# Patient Record
Sex: Female | Born: 1940 | Race: White | Hispanic: No | Marital: Single | Smoking: Former smoker
Health system: Southern US, Community
[De-identification: ages and names within clinical notes are randomized; demographics above are authoritative.]

## PROBLEM LIST (undated history)

## (undated) DIAGNOSIS — R569 Unspecified convulsions: Secondary | ICD-10-CM

## (undated) DIAGNOSIS — I10 Essential (primary) hypertension: Secondary | ICD-10-CM

## (undated) DIAGNOSIS — G609 Hereditary and idiopathic neuropathy, unspecified: Secondary | ICD-10-CM

## (undated) DIAGNOSIS — H90A22 Sensorineural hearing loss, unilateral, left ear, with restricted hearing on the contralateral side: Secondary | ICD-10-CM

## (undated) DIAGNOSIS — Z8619 Personal history of other infectious and parasitic diseases: Secondary | ICD-10-CM

## (undated) DIAGNOSIS — I639 Cerebral infarction, unspecified: Secondary | ICD-10-CM

## (undated) DIAGNOSIS — Z8579 Personal history of other malignant neoplasms of lymphoid, hematopoietic and related tissues: Secondary | ICD-10-CM

## (undated) DIAGNOSIS — M810 Age-related osteoporosis without current pathological fracture: Secondary | ICD-10-CM

## (undated) DIAGNOSIS — E559 Vitamin D deficiency, unspecified: Secondary | ICD-10-CM

## (undated) DIAGNOSIS — Z8673 Personal history of transient ischemic attack (TIA), and cerebral infarction without residual deficits: Secondary | ICD-10-CM

## (undated) HISTORY — DX: Unspecified convulsions: R56.9

## (undated) HISTORY — DX: Personal history of other malignant neoplasms of lymphoid, hematopoietic and related tissues: Z85.79

## (undated) HISTORY — DX: Personal history of other infectious and parasitic diseases: Z86.19

## (undated) HISTORY — DX: Personal history of transient ischemic attack (TIA), and cerebral infarction without residual deficits: Z86.73

## (undated) HISTORY — DX: Essential (primary) hypertension: I10

## (undated) HISTORY — DX: Sensorineural hearing loss, unilateral, left ear, with restricted hearing on the contralateral side: H90.A22

## (undated) HISTORY — DX: Hereditary and idiopathic neuropathy, unspecified: G60.9

## (undated) HISTORY — DX: Cerebral infarction, unspecified: I63.9

## (undated) HISTORY — DX: Age-related osteoporosis without current pathological fracture: M81.0

## (undated) HISTORY — DX: Vitamin D deficiency, unspecified: E55.9

---

## 2002-07-06 DIAGNOSIS — Z8579 Personal history of other malignant neoplasms of lymphoid, hematopoietic and related tissues: Secondary | ICD-10-CM

## 2002-07-06 DIAGNOSIS — Z8572 Personal history of non-Hodgkin lymphomas: Secondary | ICD-10-CM

## 2002-07-06 HISTORY — PX: SPLENECTOMY: SUR1306

## 2002-07-06 HISTORY — DX: Personal history of non-Hodgkin lymphomas: Z85.72

## 2002-07-06 HISTORY — PX: PARTIAL GASTRECTOMY: SHX2172

## 2002-07-06 HISTORY — DX: Personal history of other malignant neoplasms of lymphoid, hematopoietic and related tissues: Z85.79

## 2009-01-28 ENCOUNTER — Emergency Department: Payer: Self-pay | Admitting: Emergency Medicine

## 2009-07-17 ENCOUNTER — Emergency Department: Payer: Self-pay | Admitting: Emergency Medicine

## 2009-09-03 ENCOUNTER — Ambulatory Visit: Payer: Self-pay | Admitting: Oncology

## 2010-01-03 ENCOUNTER — Ambulatory Visit: Payer: Self-pay | Admitting: Oncology

## 2010-01-28 ENCOUNTER — Ambulatory Visit: Payer: Self-pay | Admitting: Oncology

## 2010-01-31 ENCOUNTER — Ambulatory Visit: Payer: Self-pay | Admitting: Oncology

## 2010-03-20 ENCOUNTER — Ambulatory Visit: Payer: Self-pay | Admitting: Internal Medicine

## 2010-10-08 ENCOUNTER — Ambulatory Visit: Payer: Self-pay | Admitting: Unknown Physician Specialty

## 2011-03-27 ENCOUNTER — Ambulatory Visit: Payer: Self-pay | Admitting: Oncology

## 2011-04-06 ENCOUNTER — Ambulatory Visit: Payer: Self-pay | Admitting: Oncology

## 2011-05-01 ENCOUNTER — Ambulatory Visit: Payer: Self-pay | Admitting: Internal Medicine

## 2011-05-07 ENCOUNTER — Ambulatory Visit: Payer: Self-pay | Admitting: Oncology

## 2011-08-26 DIAGNOSIS — H16149 Punctate keratitis, unspecified eye: Secondary | ICD-10-CM | POA: Diagnosis not present

## 2011-11-22 ENCOUNTER — Ambulatory Visit: Payer: Self-pay | Admitting: Internal Medicine

## 2011-12-02 DIAGNOSIS — C8589 Other specified types of non-Hodgkin lymphoma, extranodal and solid organ sites: Secondary | ICD-10-CM | POA: Diagnosis not present

## 2011-12-02 DIAGNOSIS — G40909 Epilepsy, unspecified, not intractable, without status epilepticus: Secondary | ICD-10-CM | POA: Diagnosis not present

## 2011-12-02 DIAGNOSIS — I10 Essential (primary) hypertension: Secondary | ICD-10-CM | POA: Diagnosis not present

## 2011-12-02 DIAGNOSIS — Z202 Contact with and (suspected) exposure to infections with a predominantly sexual mode of transmission: Secondary | ICD-10-CM | POA: Diagnosis not present

## 2011-12-02 DIAGNOSIS — Z79899 Other long term (current) drug therapy: Secondary | ICD-10-CM | POA: Diagnosis not present

## 2011-12-20 ENCOUNTER — Ambulatory Visit: Payer: Self-pay | Admitting: Medical

## 2011-12-20 DIAGNOSIS — S30860A Insect bite (nonvenomous) of lower back and pelvis, initial encounter: Secondary | ICD-10-CM | POA: Diagnosis not present

## 2011-12-20 DIAGNOSIS — W57XXXA Bitten or stung by nonvenomous insect and other nonvenomous arthropods, initial encounter: Secondary | ICD-10-CM | POA: Diagnosis not present

## 2011-12-30 DIAGNOSIS — H16149 Punctate keratitis, unspecified eye: Secondary | ICD-10-CM | POA: Diagnosis not present

## 2012-03-29 DIAGNOSIS — H2510 Age-related nuclear cataract, unspecified eye: Secondary | ICD-10-CM | POA: Diagnosis not present

## 2012-04-13 ENCOUNTER — Ambulatory Visit: Payer: Self-pay | Admitting: Oncology

## 2012-04-13 DIAGNOSIS — Z9889 Other specified postprocedural states: Secondary | ICD-10-CM | POA: Diagnosis not present

## 2012-04-13 DIAGNOSIS — G40909 Epilepsy, unspecified, not intractable, without status epilepticus: Secondary | ICD-10-CM | POA: Diagnosis not present

## 2012-04-13 DIAGNOSIS — Z87898 Personal history of other specified conditions: Secondary | ICD-10-CM | POA: Diagnosis not present

## 2012-04-13 DIAGNOSIS — Z79899 Other long term (current) drug therapy: Secondary | ICD-10-CM | POA: Diagnosis not present

## 2012-04-13 DIAGNOSIS — Z9089 Acquired absence of other organs: Secondary | ICD-10-CM | POA: Diagnosis not present

## 2012-04-13 LAB — CBC CANCER CENTER
Basophil %: 0.2 %
Eosinophil %: 1.7 %
HGB: 12 g/dL (ref 12.0–16.0)
Lymphocyte #: 4.1 x10 3/mm — ABNORMAL HIGH (ref 1.0–3.6)
MCH: 34.8 pg — ABNORMAL HIGH (ref 26.0–34.0)
MCHC: 34 g/dL (ref 32.0–36.0)
MCV: 102 fL — ABNORMAL HIGH (ref 80–100)
Monocyte #: 0.6 x10 3/mm (ref 0.2–0.9)
Platelet: 348 x10 3/mm (ref 150–440)
RBC: 3.47 10*6/uL — ABNORMAL LOW (ref 3.80–5.20)
WBC: 7.6 x10 3/mm (ref 3.6–11.0)

## 2012-05-06 ENCOUNTER — Ambulatory Visit: Payer: Self-pay | Admitting: Oncology

## 2012-05-20 ENCOUNTER — Encounter: Payer: Self-pay | Admitting: Family Medicine

## 2012-05-20 ENCOUNTER — Ambulatory Visit (INDEPENDENT_AMBULATORY_CARE_PROVIDER_SITE_OTHER): Payer: Medicare Other | Admitting: Family Medicine

## 2012-05-20 VITALS — BP 142/98 | HR 84 | Temp 97.8°F | Ht 64.0 in | Wt 121.8 lb

## 2012-05-20 DIAGNOSIS — R569 Unspecified convulsions: Secondary | ICD-10-CM

## 2012-05-20 DIAGNOSIS — Z23 Encounter for immunization: Secondary | ICD-10-CM | POA: Diagnosis not present

## 2012-05-20 DIAGNOSIS — R4182 Altered mental status, unspecified: Secondary | ICD-10-CM | POA: Diagnosis not present

## 2012-05-20 DIAGNOSIS — R413 Other amnesia: Secondary | ICD-10-CM

## 2012-05-20 DIAGNOSIS — R410 Disorientation, unspecified: Secondary | ICD-10-CM | POA: Insufficient documentation

## 2012-05-20 DIAGNOSIS — Z859 Personal history of malignant neoplasm, unspecified: Secondary | ICD-10-CM | POA: Diagnosis not present

## 2012-05-20 DIAGNOSIS — F0150 Vascular dementia without behavioral disturbance: Secondary | ICD-10-CM | POA: Insufficient documentation

## 2012-05-20 DIAGNOSIS — F29 Unspecified psychosis not due to a substance or known physiological condition: Secondary | ICD-10-CM

## 2012-05-20 LAB — CBC WITH DIFFERENTIAL/PLATELET
HCT: 36.3 % (ref 36.0–46.0)
Hemoglobin: 12.7 g/dL (ref 12.0–15.0)
Lymphocytes Relative: 62 % — ABNORMAL HIGH (ref 12–46)
MCV: 95.3 fL (ref 78.0–100.0)
Monocytes Absolute: 0.7 10*3/uL (ref 0.1–1.0)
Monocytes Relative: 8 % (ref 3–12)
Neutro Abs: 2.1 10*3/uL (ref 1.7–7.7)
WBC: 8 10*3/uL (ref 4.0–10.5)

## 2012-05-20 LAB — TSH: TSH: 1.933 u[IU]/mL (ref 0.350–4.500)

## 2012-05-20 NOTE — Assessment & Plan Note (Signed)
Will draw phenobarb levels as well as depakote level today to ensure not toxic levels. Have asked to request records from prior PCP.  She brings name of previous physician - will try and obtain records from them.

## 2012-05-20 NOTE — Progress Notes (Signed)
Subjective:    Patient ID: Hannah Howell, female    DOB: Jul 11, 1940, 71 y.o.   MRN: [id number]  HPI CC: new pt to establish  Presents with son, [name].  Yesterday son came from [place], was concerned about pt - concerned about visual hallucinations (pt thought her mother was staying with her, longtime deceased), not making sense. Worried excessively depression or confused.  Son has decided to move from [place] to [facility] 2/2 concern about mom.  Son very distraught over change, but states feels like cognitive decline has been present for last 5-10 years (since cancer surgery).  Pt moved 1 year ago from [place].  States she has seen a doctor and has been prescribed depakote and phenobarb but does not know any names.  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  S/p abdominal surgery.  Underwent chemotherapy.  This was about 8-10 yrs ago.    Pt and son deny EtOH, smoking.  Medications and allergies reviewed and updated in chart.  Past histories reviewed and updated if relevant as below. Patient Active Problem List  Diagnosis  . Seizures  . History of cancer   Past Medical History  Diagnosis Date  . Seizures since teen  . History of cancer     unsure what type   Past Surgical History  Procedure Date  . Abdominal surgery    History  Substance Use Topics  . Smoking status: Former Games developer  . Smokeless tobacco: Never Used  . Alcohol Use: No   Family History  Problem Relation Age of Onset  . Diabetes    . Alcohol abuse Father   . CAD Mother     MI   No Known Allergies Current Outpatient Prescriptions on File Prior to Visit  Medication Sig Dispense Refill  . Divalproex Sodium (DEPAKOTE PO) Take 250 mg by mouth 3 (three) times daily. 1 in am and 2 at night      . PHENOBARBITAL PO Take 32.4 mg by mouth 3 (three) times daily. 1 in am and 2 at night         Review of Systems  Constitutional: Negative for fever, chills, activity change, appetite change,  fatigue and unexpected weight change.  HENT: Negative for hearing loss and neck pain.   Eyes: Negative for visual disturbance.  Respiratory: Negative for cough, chest tightness, shortness of breath and wheezing.   Cardiovascular: Negative for chest pain, palpitations and leg swelling.  Gastrointestinal: Negative for nausea, vomiting, abdominal pain, diarrhea, constipation, blood in stool and abdominal distention.  Genitourinary: Negative for hematuria and difficulty urinating.  Musculoskeletal: Negative for myalgias and arthralgias.  Skin: Negative for rash.  Neurological: Negative for dizziness, seizures, syncope and headaches.  Hematological: Does not bruise/bleed easily.  Psychiatric/Behavioral: Negative for dysphoric mood. The patient is not nervous/anxious.        Objective:   Physical Exam  Nursing note and vitals reviewed. Constitutional: She is oriented to person, place, and time. She appears well-developed and well-nourished. No distress.       Pleasant, occasionally confused  HENT:  Head: Normocephalic and atraumatic.  Right Ear: Hearing, tympanic membrane, external ear and ear canal normal.  Left Ear: Hearing, tympanic membrane, external ear and ear canal normal.  Nose: Nose normal.  Mouth/Throat: Oropharynx is clear and moist. No oropharyngeal exudate.  Eyes: Conjunctivae normal and EOM are normal. Pupils are equal, round, and reactive to light. No scleral icterus.  Neck: Normal range of motion. Neck supple. No thyromegaly present.  Cardiovascular: Normal rate, regular rhythm, normal heart sounds and intact distal pulses.   No murmur heard. Pulses:      Radial pulses are 2+ on the right side, and 2+ on the left side.  Pulmonary/Chest: Effort normal and breath sounds normal. No respiratory distress. She has no wheezes. She has no rales.  Abdominal: Soft. Bowel sounds are normal. She exhibits no distension and no mass. There is no tenderness. There is no rebound and no  guarding.  Musculoskeletal: Normal range of motion. She exhibits no edema.  Lymphadenopathy:    She has no cervical adenopathy.  Neurological: She is alert and oriented to person, place, and time.       CN grossly intact, station and gait intact  Skin: Skin is warm and dry. No rash noted.  Psychiatric: She has a normal mood and affect. Her behavior is normal. Judgment and thought content normal.       A&O x1 Did not know date. "Monday, March, 1920s". Did know name of state but no other location information. Serial 3s 5/5 Registration 3/3 Recall 0/3 When asked if she drives, comes up with strange story "everyone else wanted to drive my car so I let them and now I don't drive"       Assessment & Plan:

## 2012-05-20 NOTE — Assessment & Plan Note (Signed)
Again unclear diagnosis.  Will request records. Check basic blood work today.

## 2012-05-20 NOTE — Patient Instructions (Signed)
Return in 2-3 weeks for follow up. Good to see you today, call us with questions. Blood work today. Flu shot today.

## 2012-05-20 NOTE — Assessment & Plan Note (Addendum)
Difficult given first time meeting her, but does not feel like acute delirium presentation (although some of son's concerns do sound consistent with altered sensorium).   No aggression or behavior changes endorsed by son.  Longstanding progressive decline. Differential includes uncontrolled seizure activity, AED overdose, malignancy related deterioration, senile dementia, metabolic insult or substance related dementia, or CVA related dementia. Start workup with blood work, consider head CT next visit. I have asked them to return in 2-3 wks for geriatric assessment.  Pt/son agree w/plan.

## 2012-05-20 NOTE — Assessment & Plan Note (Signed)
See above

## 2012-05-21 LAB — COMPREHENSIVE METABOLIC PANEL
ALT: 8 U/L (ref 0–35)
Alkaline Phosphatase: 72 U/L (ref 39–117)
CO2: 21 mEq/L (ref 19–32)
Creat: 0.79 mg/dL (ref 0.50–1.10)
Total Bilirubin: 0.4 mg/dL (ref 0.3–1.2)

## 2012-05-21 LAB — RPR

## 2012-05-21 LAB — PHENOBARBITAL LEVEL: Phenobarbital: 47.9 ug/mL — ABNORMAL HIGH (ref 15.0–40.0)

## 2012-05-21 LAB — VALPROIC ACID LEVEL: Valproic Acid Lvl: 75 ug/mL (ref 50.0–100.0)

## 2012-05-23 ENCOUNTER — Ambulatory Visit (INDEPENDENT_AMBULATORY_CARE_PROVIDER_SITE_OTHER)
Admission: RE | Admit: 2012-05-23 | Discharge: 2012-05-23 | Disposition: A | Discharge status: Home / Self Care | Payer: Medicare Other | Source: Ambulatory Visit | Attending: Family Medicine | Admitting: Family Medicine

## 2012-05-23 ENCOUNTER — Encounter: Payer: Self-pay | Admitting: Family Medicine

## 2012-05-23 ENCOUNTER — Telehealth: Payer: Self-pay | Admitting: Family Medicine

## 2012-05-23 ENCOUNTER — Ambulatory Visit (INDEPENDENT_AMBULATORY_CARE_PROVIDER_SITE_OTHER): Payer: Medicare Other | Admitting: Family Medicine

## 2012-05-23 DIAGNOSIS — M79609 Pain in unspecified limb: Secondary | ICD-10-CM

## 2012-05-23 DIAGNOSIS — F29 Unspecified psychosis not due to a substance or known physiological condition: Secondary | ICD-10-CM

## 2012-05-23 DIAGNOSIS — Z1211 Encounter for screening for malignant neoplasm of colon: Secondary | ICD-10-CM

## 2012-05-23 DIAGNOSIS — R569 Unspecified convulsions: Secondary | ICD-10-CM | POA: Diagnosis not present

## 2012-05-23 DIAGNOSIS — M79601 Pain in right arm: Secondary | ICD-10-CM

## 2012-05-23 DIAGNOSIS — Z859 Personal history of malignant neoplasm, unspecified: Secondary | ICD-10-CM

## 2012-05-23 DIAGNOSIS — M19019 Primary osteoarthritis, unspecified shoulder: Secondary | ICD-10-CM | POA: Diagnosis not present

## 2012-05-23 DIAGNOSIS — R410 Disorientation, unspecified: Secondary | ICD-10-CM

## 2012-05-23 LAB — MAGNESIUM: Magnesium: 2.1 mg/dL (ref 1.5–2.5)

## 2012-05-23 NOTE — Progress Notes (Signed)
  Subjective:    Patient ID: Hannah Howell, female    DOB: 1940-11-08, 71 y.o.   MRN: [id number]  HPI CC: f/u abnormal labs  See prior note for details.  Today feels well.  Workup for confusion revealed significantly low calcium level to 5.9, normal albumin at 5.1.  Son notes significant improvement in mentation, attributes this to improved nutrition status (son has been ensuring pt eats adequate meals).  Still having R arm pain.  Denies inciting trauma/injury.  Did fall in ice 2 years ago.  States chiropractor did help.  Arm pain does travel down to fingers and occasionally up past shoulder.  Denies weakness or numbness of arm.  No neck pain.  Constant pain.  Still denies recent seizures, h/o EtOH.  No recent breast exam/mammogram.  No recent colonoscopy but pt denies blood in stool.  Review of Systems Per HPI    Objective:   Physical Exam  Nursing note and vitals reviewed. Constitutional: She appears well-developed and well-nourished. No distress.  HENT:  Head: Normocephalic and atraumatic.  Mouth/Throat: Oropharynx is clear and moist. No oropharyngeal exudate.  Eyes: Conjunctivae normal and EOM are normal. Pupils are equal, round, and reactive to light.  Neck: Normal range of motion. No thyromegaly present.  Cardiovascular: Normal rate, regular rhythm, normal heart sounds and intact distal pulses.   No murmur heard. Pulmonary/Chest: Effort normal and breath sounds normal. No respiratory distress. She has no wheezes. She has no rales. Right breast exhibits no inverted nipple, no mass, no nipple discharge and no skin change. Left breast exhibits no inverted nipple, no mass, no nipple discharge, no skin change and no tenderness.  Musculoskeletal:       No midline spine tenderness. FROM at neck and shoulders. Neg spurling bilaterally. No pain with int/ext rotation at shoulder.   + empty can sign on right. No pain with palpation of entire R arm.  Lymphadenopathy:    She has no  cervical adenopathy.    She has no axillary adenopathy.       Right axillary: No lateral adenopathy present.       Left axillary: No lateral adenopathy present.      Right: No supraclavicular adenopathy present.       Left: No supraclavicular adenopathy present.  Neurological: She has normal strength. No sensory deficit.  Reflex Scores:      Bicep reflexes are 2+ on the right side and 2+ on the left side. Psychiatric: She has a normal mood and affect.       Less confused today       Assessment & Plan:

## 2012-05-23 NOTE — Patient Instructions (Addendum)
Lets start by decreasing phenobarbital to one in am and one at night. Further blood work today. Let's start calcium 600mg  and vitamin D 400 units twice daily.  Increase dairy products and calcium fortified orange juice. Breast exam today.  Stool kit today. Xray of right shoulder today - we will call you with results.  To figure out dietary calcium: 300 mg/day from all non dairy foods plus 300 mg per cup of milk, other dairy, or fortified juice.  Non dairy foods that contain calcium: Kale, oranges, sardines, oatmeal, soy milk/soybeans, salmon, white beans, dried figs, turnip greens, almonds, broccoli, tofu.

## 2012-05-23 NOTE — Telephone Encounter (Signed)
[facility] Triage Call Report Triage Record Num: [id number] Operator: Judeen Hammans Patient Name: Hannah Howell Call Date & Time: 05/21/2012 3:05:42AM Patient Phone: [phone] PCP: Eustaquio Boyden Patient Gender: Female PCP Fax : [phone] Patient DOB: 17-Jan-1941 Practice Name: [facility] Reason for Call: Caller: Babette Relic; PCP: Eustaquio Boyden [facility]); CB#: [phone]; Call regarding [name] is calling from [facility] regarding a Calcium critical low at 5.9 ordered on [name] by Eustaquio Boyden [facility]).; Results repeated and verified. Sample drawn 11/15 at 16:13. Dr. Milinda Cave notified; said to check in with patient in the morning to determine significant change in sxs and repeat Calcium level in office on Monday 11/18. If patient is symptomatic, call Dr. Milinda Cave back and give update. Follow-up to above call 11/16 at 8:10 a.m.: Spoke with patient's son, [name], regarding sxs. Patient states she feels fine this morning according to son. Advised to call back with any new or worsening symptoms at any time this weekend. Appt scheduled for 11/18 at noon for Calcium level check and re-assessment as needed per Dr. Milinda Cave order. Protocol(s) Used: Office Note Recommended Outcome per Protocol: Information Noted and Sent to Office Reason for Outcome: Caller information to office Care Advice: ~ 05/21/2012 8:26:31AM Page 1 of 1 [id number]

## 2012-05-23 NOTE — Telephone Encounter (Signed)
Will see today.  

## 2012-05-24 LAB — PTH, INTACT AND CALCIUM: PTH: 43.3 pg/mL (ref 14.0–72.0)

## 2012-05-24 LAB — FOLATE: Folate: 6.7 ng/mL (ref >5.4)

## 2012-05-26 ENCOUNTER — Encounter: Payer: Self-pay | Admitting: Family Medicine

## 2012-05-26 DIAGNOSIS — M79601 Pain in right arm: Secondary | ICD-10-CM | POA: Insufficient documentation

## 2012-05-26 NOTE — Assessment & Plan Note (Signed)
Present for last several years intermittently, worsening recently. Checked Xray given longstanding nature of complaint - negative for fracture, masses, etc.  + arthritis Anticipate RTC injury and tendinosis - rec rest, consider rehab if not improving.

## 2012-05-26 NOTE — Assessment & Plan Note (Signed)
I have requested records of prior ?stomach cancer history.

## 2012-05-26 NOTE — Assessment & Plan Note (Signed)
Significant improvement noted in last 3 days.  ?all related to nutritional status.  See below. Continue course, return in 1 wk for geriatric assessment.

## 2012-05-26 NOTE — Assessment & Plan Note (Addendum)
Phenobarb level returned elevated.  Decrease to 1 pill 32.4mg  bid.

## 2012-05-26 NOTE — Assessment & Plan Note (Addendum)
Severe hypocalcemia found on blood work last week.  Recheck today, along with PTH, phosphorus, and vitamin D level. Could very well account for confusion/AMS. Breast exam WNL today.  Sent home with stool kit. Recommended start daily calcium/vit D 600/400 bid.

## 2012-05-31 ENCOUNTER — Ambulatory Visit: Payer: Medicare Other | Admitting: Family Medicine

## 2012-06-01 ENCOUNTER — Telehealth: Payer: Self-pay | Admitting: Family Medicine

## 2012-06-01 NOTE — Telephone Encounter (Signed)
Mr. [name] came in with his mother, Hannah Howell who is the pt. He says you ordered a stool sample for pt, but due to her memory capacity, pt keeps forgetting to do the stool sample when she goes to the bathroom.  Mr. [name] wants to know is there another way to get what you need in order to run the tests you want to run w/the stool samples? If so, please advise, but if not, do you have any suggestions on how to make sure pt completes the stool sample? Thank you.

## 2012-06-01 NOTE — Telephone Encounter (Signed)
Recommend ask mom each time she goes to bathroom if stool - and then go in before her to place paper on toilet. plz ensure they understand instructions for collecting stool. Keep trying! Can discuss at CPE.

## 2012-06-03 NOTE — Telephone Encounter (Signed)
LMOVM in detail.  

## 2012-06-06 ENCOUNTER — Inpatient Hospital Stay: Payer: Self-pay | Admitting: Internal Medicine

## 2012-06-06 DIAGNOSIS — Z79899 Other long term (current) drug therapy: Secondary | ICD-10-CM | POA: Diagnosis not present

## 2012-06-06 DIAGNOSIS — F068 Other specified mental disorders due to known physiological condition: Secondary | ICD-10-CM | POA: Diagnosis not present

## 2012-06-06 DIAGNOSIS — G3184 Mild cognitive impairment, so stated: Secondary | ICD-10-CM | POA: Diagnosis not present

## 2012-06-06 DIAGNOSIS — I6350 Cerebral infarction due to unspecified occlusion or stenosis of unspecified cerebral artery: Secondary | ICD-10-CM | POA: Diagnosis not present

## 2012-06-06 DIAGNOSIS — Z8249 Family history of ischemic heart disease and other diseases of the circulatory system: Secondary | ICD-10-CM | POA: Diagnosis not present

## 2012-06-06 DIAGNOSIS — Z23 Encounter for immunization: Secondary | ICD-10-CM | POA: Diagnosis not present

## 2012-06-06 DIAGNOSIS — F0390 Unspecified dementia without behavioral disturbance: Secondary | ICD-10-CM | POA: Diagnosis not present

## 2012-06-06 DIAGNOSIS — Z8500 Personal history of malignant neoplasm of unspecified digestive organ: Secondary | ICD-10-CM | POA: Diagnosis not present

## 2012-06-06 DIAGNOSIS — R4182 Altered mental status, unspecified: Secondary | ICD-10-CM | POA: Diagnosis not present

## 2012-06-06 DIAGNOSIS — R6889 Other general symptoms and signs: Secondary | ICD-10-CM | POA: Diagnosis not present

## 2012-06-06 DIAGNOSIS — D72829 Elevated white blood cell count, unspecified: Secondary | ICD-10-CM | POA: Diagnosis not present

## 2012-06-06 DIAGNOSIS — S069X9S Unspecified intracranial injury with loss of consciousness of unspecified duration, sequela: Secondary | ICD-10-CM | POA: Diagnosis not present

## 2012-06-06 DIAGNOSIS — R569 Unspecified convulsions: Secondary | ICD-10-CM | POA: Diagnosis not present

## 2012-06-06 DIAGNOSIS — E860 Dehydration: Secondary | ICD-10-CM | POA: Diagnosis not present

## 2012-06-06 DIAGNOSIS — Z87891 Personal history of nicotine dependence: Secondary | ICD-10-CM | POA: Diagnosis not present

## 2012-06-06 DIAGNOSIS — G40909 Epilepsy, unspecified, not intractable, without status epilepticus: Secondary | ICD-10-CM | POA: Diagnosis not present

## 2012-06-06 LAB — COMPREHENSIVE METABOLIC PANEL
Albumin: 3.9 g/dL (ref 3.4–5.0)
Alkaline Phosphatase: 91 U/L (ref 50–136)
Anion Gap: 7 (ref 7–16)
BUN: 14 mg/dL (ref 7–18)
Bilirubin,Total: 0.3 mg/dL (ref 0.2–1.0)
Calcium, Total: 8.8 mg/dL (ref 8.5–10.1)
Chloride: 107 mmol/L (ref 98–107)
Co2: 28 mmol/L (ref 21–32)
Creatinine: 0.8 mg/dL (ref 0.60–1.30)
EGFR (African American): >60
EGFR (Non-African Amer.): >60
Glucose: 120 mg/dL — ABNORMAL HIGH (ref 65–99)
Osmolality: 285 (ref 275–301)
Potassium: 3.7 mmol/L (ref 3.5–5.1)
SGOT(AST): 24 U/L (ref 15–37)
SGPT (ALT): 17 U/L (ref 12–78)
Sodium: 142 mmol/L (ref 136–145)
Total Protein: 7.3 g/dL (ref 6.4–8.2)

## 2012-06-06 LAB — CBC
HCT: 35.7 % (ref 35.0–47.0)
HGB: 12.1 g/dL (ref 12.0–16.0)
MCH: 34.3 pg — ABNORMAL HIGH (ref 26.0–34.0)
MCHC: 33.9 g/dL (ref 32.0–36.0)
MCV: 101 fL — ABNORMAL HIGH (ref 80–100)
Platelet: 398 10*3/uL (ref 150–440)
RBC: 3.52 10*6/uL — ABNORMAL LOW (ref 3.80–5.20)
RDW: 13.9 % (ref 11.5–14.5)
WBC: 15.8 10*3/uL — ABNORMAL HIGH (ref 3.6–11.0)

## 2012-06-06 LAB — URINALYSIS, COMPLETE
Bilirubin,UR: NEGATIVE
Glucose,UR: NEGATIVE mg/dL (ref 0–75)
RBC,UR: 3 /HPF (ref 0–5)
Specific Gravity: 1.009 (ref 1.003–1.030)
Squamous Epithelial: 1

## 2012-06-06 LAB — VALPROIC ACID LEVEL: Valproic Acid: 31 ug/mL — ABNORMAL LOW

## 2012-06-07 LAB — BASIC METABOLIC PANEL
BUN: 11 mg/dL (ref 7–18)
Chloride: 111 mmol/L — ABNORMAL HIGH (ref 98–107)
Co2: 27 mmol/L (ref 21–32)
Creatinine: 0.72 mg/dL (ref 0.60–1.30)
EGFR (Non-African Amer.): >60
Osmolality: 286 (ref 275–301)
Potassium: 3.6 mmol/L (ref 3.5–5.1)
Sodium: 144 mmol/L (ref 136–145)

## 2012-06-07 LAB — CBC WITH DIFFERENTIAL/PLATELET
Basophil #: 0.1 10*3/uL (ref 0.0–0.1)
Eosinophil #: 0.2 10*3/uL (ref 0.0–0.7)
HCT: 30.2 % — ABNORMAL LOW (ref 35.0–47.0)
Lymphocyte %: 29.4 %
MCH: 34.5 pg — ABNORMAL HIGH (ref 26.0–34.0)
MCV: 101 fL — ABNORMAL HIGH (ref 80–100)
Monocyte %: 5.5 %
Neutrophil #: 8.7 10*3/uL — ABNORMAL HIGH (ref 1.4–6.5)
RDW: 14 % (ref 11.5–14.5)
WBC: 13.9 10*3/uL — ABNORMAL HIGH (ref 3.6–11.0)

## 2012-06-07 LAB — MAGNESIUM: Magnesium: 1.7 mg/dL — ABNORMAL LOW

## 2012-06-08 LAB — URINE CULTURE

## 2012-06-08 LAB — CBC WITH DIFFERENTIAL/PLATELET
Basophil #: 0.1 10*3/uL (ref 0.0–0.1)
Eosinophil #: 0.7 10*3/uL (ref 0.0–0.7)
Eosinophil %: 8.2 %
Lymphocyte #: 3.3 10*3/uL (ref 1.0–3.6)
MCH: 34.1 pg — ABNORMAL HIGH (ref 26.0–34.0)
Monocyte #: 0.7 x10 3/mm (ref 0.2–0.9)
Monocyte %: 9 %
Neutrophil #: 3.4 10*3/uL (ref 1.4–6.5)
Neutrophil %: 41.4 %
Platelet: 398 10*3/uL (ref 150–440)
RDW: 13.8 % (ref 11.5–14.5)
WBC: 8.1 10*3/uL (ref 3.6–11.0)

## 2012-06-09 ENCOUNTER — Ambulatory Visit: Payer: Medicare Other | Admitting: Family Medicine

## 2012-06-10 DIAGNOSIS — E559 Vitamin D deficiency, unspecified: Secondary | ICD-10-CM | POA: Diagnosis not present

## 2012-06-10 DIAGNOSIS — G40909 Epilepsy, unspecified, not intractable, without status epilepticus: Secondary | ICD-10-CM | POA: Diagnosis not present

## 2012-06-10 DIAGNOSIS — I10 Essential (primary) hypertension: Secondary | ICD-10-CM | POA: Diagnosis not present

## 2012-06-10 DIAGNOSIS — F0390 Unspecified dementia without behavioral disturbance: Secondary | ICD-10-CM | POA: Diagnosis not present

## 2012-06-13 ENCOUNTER — Ambulatory Visit (INDEPENDENT_AMBULATORY_CARE_PROVIDER_SITE_OTHER): Payer: Medicare Other | Admitting: Family Medicine

## 2012-06-13 ENCOUNTER — Encounter: Payer: Self-pay | Admitting: Family Medicine

## 2012-06-13 VITALS — BP 150/78 | HR 78 | Temp 98.1°F | Wt 117.5 lb

## 2012-06-13 DIAGNOSIS — R413 Other amnesia: Secondary | ICD-10-CM

## 2012-06-13 DIAGNOSIS — R569 Unspecified convulsions: Secondary | ICD-10-CM | POA: Diagnosis not present

## 2012-06-13 DIAGNOSIS — E559 Vitamin D deficiency, unspecified: Secondary | ICD-10-CM | POA: Insufficient documentation

## 2012-06-13 MED ORDER — PHENOBARBITAL 32.4 MG PO TABS: 32.4000 mg | ORAL_TABLET | Freq: Three times a day (TID) | ORAL | Status: DC

## 2012-06-13 MED ORDER — DIVALPROEX SODIUM 250 MG PO DR TAB: 250.0000 mg | DELAYED_RELEASE_TABLET | Freq: Three times a day (TID) | ORAL | Status: DC

## 2012-06-13 NOTE — Assessment & Plan Note (Signed)
Continued concern for early dementia although son states told no dementia in hospital.  ?stroke. On aricept 5mg  daily.  continue to monitor.

## 2012-06-13 NOTE — Assessment & Plan Note (Signed)
discussed replacement with vit D 1000 and cal/vit D supplements.

## 2012-06-13 NOTE — Progress Notes (Addendum)
  Subjective:    Patient ID: Hannah Howell, female    DOB: [birth year], 71 y.o.   MRN: [id number]  HPI CC: f/u hospitalization  Admission: 06/06/2012 Discharge: 06/09/2012 No records available yet.  Will review when arrive.  Had several seizures last week - hospitalized for this at [facility].  Son wonders if pt is forgetting to take doses of antiepileptics.  Seen by Dr. Sherryll Burger in hospital., has f/u appt with him 06/17/2012.   EEG done along with head imaging per son.  Told no dementia, evidence of small stroke.    Taking depakote 250mg  6am, 2pm, 10pm.   Taking phenobarb 32.4mg  tid as well. Started on aricept 5mg  daily.  HH to go to house.  Son to hire private home aid as well.  Son planning on eventually returning to [place].  Sister planning on coming to visit.  Has set up power of attorney with son.  Denies seizure activity since discharge.  Past Medical History  Diagnosis Date  . Seizures since teen  . History of cancer     unsure what type  . Vitamin D deficiency     Review of Systems Per HPI    Objective:   Physical Exam  Nursing note and vitals reviewed. Constitutional: She appears well-developed and well-nourished. No distress.  HENT:  Head: Normocephalic and atraumatic.  Mouth/Throat: Oropharynx is clear and moist. No oropharyngeal exudate.  Cardiovascular: Normal rate, regular rhythm, normal heart sounds and intact distal pulses.   No murmur heard. Pulmonary/Chest: Effort normal and breath sounds normal. No respiratory distress. She has no wheezes.  Skin: Skin is warm and dry. No rash noted.  Psychiatric: She has a normal mood and affect.       Assessment & Plan:  ADDENDUM: received, reviewed H&P from [facility] - seen for new onset seizures, dx with early dementia.  CBC WNL, BMP WNL, CR 0.7, Mg 1.7, CT head - new lacunar infarcts since 2011 in R basal ganglia, no acute infarct or hemorrhage, MRI - chronic ischemic changes with R max sinusitis.  EEG - diffuse slowing  indicative of bihemispheric dysfunction, isolated sharply contoured discharge L temporal head.

## 2012-06-13 NOTE — Assessment & Plan Note (Signed)
Recurrent seizure activity, possibly due to missed AED doses.  Recommended regular seizure med use, currently on depakote and phenobarb tid.  Has f/u with neurology outpatient. Will review records when they arrive.

## 2012-06-13 NOTE — Patient Instructions (Signed)
You're doing well. let's see what Dr. Sherryll Burger says about seizure medicines. Continue medicines as up to now.

## 2012-06-13 NOTE — Assessment & Plan Note (Signed)
Resolved as of latest blood work.

## 2012-06-14 ENCOUNTER — Other Ambulatory Visit: Payer: Medicare Other

## 2012-06-14 ENCOUNTER — Encounter: Payer: Self-pay | Admitting: *Deleted

## 2012-06-14 DIAGNOSIS — G40909 Epilepsy, unspecified, not intractable, without status epilepticus: Secondary | ICD-10-CM | POA: Diagnosis not present

## 2012-06-14 DIAGNOSIS — E559 Vitamin D deficiency, unspecified: Secondary | ICD-10-CM | POA: Diagnosis not present

## 2012-06-14 DIAGNOSIS — F0390 Unspecified dementia without behavioral disturbance: Secondary | ICD-10-CM | POA: Diagnosis not present

## 2012-06-14 DIAGNOSIS — I10 Essential (primary) hypertension: Secondary | ICD-10-CM | POA: Diagnosis not present

## 2012-06-14 DIAGNOSIS — Z1211 Encounter for screening for malignant neoplasm of colon: Secondary | ICD-10-CM

## 2012-06-15 DIAGNOSIS — E559 Vitamin D deficiency, unspecified: Secondary | ICD-10-CM | POA: Diagnosis not present

## 2012-06-15 DIAGNOSIS — G40909 Epilepsy, unspecified, not intractable, without status epilepticus: Secondary | ICD-10-CM | POA: Diagnosis not present

## 2012-06-15 DIAGNOSIS — F0390 Unspecified dementia without behavioral disturbance: Secondary | ICD-10-CM | POA: Diagnosis not present

## 2012-06-15 DIAGNOSIS — I10 Essential (primary) hypertension: Secondary | ICD-10-CM | POA: Diagnosis not present

## 2012-06-17 DIAGNOSIS — G40219 Localization-related (focal) (partial) symptomatic epilepsy and epileptic syndromes with complex partial seizures, intractable, without status epilepticus: Secondary | ICD-10-CM | POA: Diagnosis not present

## 2012-06-17 DIAGNOSIS — I69919 Unspecified symptoms and signs involving cognitive functions following unspecified cerebrovascular disease: Secondary | ICD-10-CM | POA: Diagnosis not present

## 2012-06-17 DIAGNOSIS — R413 Other amnesia: Secondary | ICD-10-CM | POA: Diagnosis not present

## 2012-06-20 ENCOUNTER — Encounter: Payer: Self-pay | Admitting: Family Medicine

## 2012-06-21 DIAGNOSIS — G40909 Epilepsy, unspecified, not intractable, without status epilepticus: Secondary | ICD-10-CM | POA: Diagnosis not present

## 2012-06-21 DIAGNOSIS — F0390 Unspecified dementia without behavioral disturbance: Secondary | ICD-10-CM | POA: Diagnosis not present

## 2012-06-21 DIAGNOSIS — E559 Vitamin D deficiency, unspecified: Secondary | ICD-10-CM | POA: Diagnosis not present

## 2012-06-21 DIAGNOSIS — I10 Essential (primary) hypertension: Secondary | ICD-10-CM | POA: Diagnosis not present

## 2012-06-23 DIAGNOSIS — E559 Vitamin D deficiency, unspecified: Secondary | ICD-10-CM | POA: Diagnosis not present

## 2012-06-23 DIAGNOSIS — I10 Essential (primary) hypertension: Secondary | ICD-10-CM | POA: Diagnosis not present

## 2012-06-23 DIAGNOSIS — G40909 Epilepsy, unspecified, not intractable, without status epilepticus: Secondary | ICD-10-CM | POA: Diagnosis not present

## 2012-06-23 DIAGNOSIS — F0390 Unspecified dementia without behavioral disturbance: Secondary | ICD-10-CM | POA: Diagnosis not present

## 2012-07-05 DIAGNOSIS — G40909 Epilepsy, unspecified, not intractable, without status epilepticus: Secondary | ICD-10-CM | POA: Diagnosis not present

## 2012-07-05 DIAGNOSIS — I10 Essential (primary) hypertension: Secondary | ICD-10-CM | POA: Diagnosis not present

## 2012-07-05 DIAGNOSIS — E559 Vitamin D deficiency, unspecified: Secondary | ICD-10-CM | POA: Diagnosis not present

## 2012-07-05 DIAGNOSIS — F0390 Unspecified dementia without behavioral disturbance: Secondary | ICD-10-CM | POA: Diagnosis not present

## 2012-07-06 HISTORY — PX: CATARACT EXTRACTION: SUR2

## 2012-07-13 DIAGNOSIS — I10 Essential (primary) hypertension: Secondary | ICD-10-CM | POA: Diagnosis not present

## 2012-07-13 DIAGNOSIS — E559 Vitamin D deficiency, unspecified: Secondary | ICD-10-CM | POA: Diagnosis not present

## 2012-07-13 DIAGNOSIS — F0390 Unspecified dementia without behavioral disturbance: Secondary | ICD-10-CM | POA: Diagnosis not present

## 2012-07-13 DIAGNOSIS — G40909 Epilepsy, unspecified, not intractable, without status epilepticus: Secondary | ICD-10-CM | POA: Diagnosis not present

## 2012-07-14 ENCOUNTER — Encounter: Payer: Self-pay | Admitting: Family Medicine

## 2012-07-14 DIAGNOSIS — IMO0001 Reserved for inherently not codable concepts without codable children: Secondary | ICD-10-CM | POA: Diagnosis not present

## 2012-07-14 DIAGNOSIS — G40219 Localization-related (focal) (partial) symptomatic epilepsy and epileptic syndromes with complex partial seizures, intractable, without status epilepticus: Secondary | ICD-10-CM | POA: Diagnosis not present

## 2012-07-18 DIAGNOSIS — I10 Essential (primary) hypertension: Secondary | ICD-10-CM | POA: Diagnosis not present

## 2012-07-18 DIAGNOSIS — E559 Vitamin D deficiency, unspecified: Secondary | ICD-10-CM | POA: Diagnosis not present

## 2012-07-18 DIAGNOSIS — G40909 Epilepsy, unspecified, not intractable, without status epilepticus: Secondary | ICD-10-CM | POA: Diagnosis not present

## 2012-07-18 DIAGNOSIS — F0390 Unspecified dementia without behavioral disturbance: Secondary | ICD-10-CM | POA: Diagnosis not present

## 2012-07-19 DIAGNOSIS — H268 Other specified cataract: Secondary | ICD-10-CM | POA: Diagnosis not present

## 2012-07-19 DIAGNOSIS — Z961 Presence of intraocular lens: Secondary | ICD-10-CM | POA: Diagnosis not present

## 2012-07-19 DIAGNOSIS — H259 Unspecified age-related cataract: Secondary | ICD-10-CM | POA: Diagnosis not present

## 2012-07-19 DIAGNOSIS — H2510 Age-related nuclear cataract, unspecified eye: Secondary | ICD-10-CM | POA: Diagnosis not present

## 2012-07-22 DIAGNOSIS — I10 Essential (primary) hypertension: Secondary | ICD-10-CM | POA: Diagnosis not present

## 2012-07-22 DIAGNOSIS — G40909 Epilepsy, unspecified, not intractable, without status epilepticus: Secondary | ICD-10-CM | POA: Diagnosis not present

## 2012-07-22 DIAGNOSIS — F0390 Unspecified dementia without behavioral disturbance: Secondary | ICD-10-CM | POA: Diagnosis not present

## 2012-07-22 DIAGNOSIS — E559 Vitamin D deficiency, unspecified: Secondary | ICD-10-CM | POA: Diagnosis not present

## 2012-07-24 ENCOUNTER — Encounter: Payer: Self-pay | Admitting: Family Medicine

## 2012-08-04 DIAGNOSIS — E559 Vitamin D deficiency, unspecified: Secondary | ICD-10-CM | POA: Diagnosis not present

## 2012-08-04 DIAGNOSIS — F0390 Unspecified dementia without behavioral disturbance: Secondary | ICD-10-CM | POA: Diagnosis not present

## 2012-08-04 DIAGNOSIS — I10 Essential (primary) hypertension: Secondary | ICD-10-CM | POA: Diagnosis not present

## 2012-08-04 DIAGNOSIS — G40909 Epilepsy, unspecified, not intractable, without status epilepticus: Secondary | ICD-10-CM | POA: Diagnosis not present

## 2012-08-29 ENCOUNTER — Encounter: Payer: Self-pay | Admitting: Family Medicine

## 2012-08-29 ENCOUNTER — Ambulatory Visit (INDEPENDENT_AMBULATORY_CARE_PROVIDER_SITE_OTHER): Payer: Medicare Other | Admitting: Family Medicine

## 2012-08-29 VITALS — BP 160/96 | HR 80 | Temp 97.9°F | Wt 115.0 lb

## 2012-08-29 DIAGNOSIS — R569 Unspecified convulsions: Secondary | ICD-10-CM | POA: Diagnosis not present

## 2012-08-29 DIAGNOSIS — R413 Other amnesia: Secondary | ICD-10-CM

## 2012-08-29 DIAGNOSIS — R410 Disorientation, unspecified: Secondary | ICD-10-CM

## 2012-08-29 DIAGNOSIS — I639 Cerebral infarction, unspecified: Secondary | ICD-10-CM

## 2012-08-29 DIAGNOSIS — I10 Essential (primary) hypertension: Secondary | ICD-10-CM | POA: Diagnosis not present

## 2012-08-29 DIAGNOSIS — Z8673 Personal history of transient ischemic attack (TIA), and cerebral infarction without residual deficits: Secondary | ICD-10-CM

## 2012-08-29 DIAGNOSIS — I6350 Cerebral infarction due to unspecified occlusion or stenosis of unspecified cerebral artery: Secondary | ICD-10-CM

## 2012-08-29 DIAGNOSIS — Z8579 Personal history of other malignant neoplasms of lymphoid, hematopoietic and related tissues: Secondary | ICD-10-CM

## 2012-08-29 DIAGNOSIS — Z87898 Personal history of other specified conditions: Secondary | ICD-10-CM

## 2012-08-29 DIAGNOSIS — F29 Unspecified psychosis not due to a substance or known physiological condition: Secondary | ICD-10-CM

## 2012-08-29 MED ORDER — HYDROCHLOROTHIAZIDE 12.5 MG PO TABS: 12.5000 mg | ORAL_TABLET | Freq: Every day | ORAL | Status: DC

## 2012-08-29 NOTE — Progress Notes (Signed)
  Subjective:    Patient ID: Hannah Howell, female    DOB: 1940/12/27, 72 y.o.   MRN: [id number]  HPI CC: 2 mo f/u  Presents with daughter Misty Stanley today who is living with patient.  Misty Stanley needs to go back home soon.  Misty Stanley has not seen signs of early dementia at all.  Using alarm to remind her to take pills.  Overall doing well.  Some concern of needing assisted living, but daughter has not seen evidence of this.  Requests my opinion.  Sees Dr. Sherryll Burger who according to daughter doesn't think there's dementia issue, but rather memory issues due to uncontrolled seizures.  However, pt has continued on aricept 5mg  daily.  Thinks next appt with Dr. Sherryll Burger is late March.  Endorses compliance with all meds. No seizures in last several months.  HTN - blood pressure staying consistently elevated. Daughter states at home bp running well controlled. BP Readings from Last 3 Encounters:  08/29/12 150/98  06/13/12 150/78  05/23/12 122/76    Past Medical History  Diagnosis Date  . Seizures since teen    following head trauma as 72 yo (Dr. Sherryll Burger, [facility])  . 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 Vitamin D deficiency   . History of stroke     on imaging - lacunar R and L pons and R occipital  . Idiopathic peripheral neuropathy   . History of chicken pox   . Hypertension    Past Surgical History  Procedure Laterality Date  . 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 Partial gastrectomy  2004    lymphoma   Family History  Problem Relation Age of Onset  . Diabetes    . Alcohol abuse Father   . CAD Mother     MI   Review of Systems Per HPI    Objective:   Physical Exam  Nursing note and vitals reviewed. Constitutional: She appears well-developed and well-nourished. No distress.  HENT:  Head: Normocephalic and atraumatic.  Right Ear: Hearing, tympanic membrane, external ear and ear canal normal.  Left Ear: Hearing, tympanic membrane, external ear and ear canal normal.  Nose: No mucosal edema or rhinorrhea.  Right sinus exhibits no maxillary sinus tenderness and no frontal sinus tenderness. Left sinus exhibits no maxillary sinus tenderness and no frontal sinus tenderness.  Mouth/Throat: Uvula is midline, oropharynx is clear and moist and mucous membranes are normal. No oropharyngeal exudate, posterior oropharyngeal edema, posterior oropharyngeal erythema or tonsillar abscesses.  Eyes: Conjunctivae and EOM are normal. Pupils are equal, round, and reactive to light. No scleral icterus.  Neck: Normal range of motion. Neck supple.  Cardiovascular: Normal rate, regular rhythm, normal heart sounds and intact distal pulses.   No murmur heard. Pulmonary/Chest: Effort normal and breath sounds normal. No respiratory distress. She has no wheezes. She has no rales.  Musculoskeletal: She exhibits no edema.  Lymphadenopathy:    She has no cervical adenopathy.  Skin: Skin is warm and dry. No rash noted.  Psychiatric: She has a normal mood and affect.       Assessment & Plan:

## 2012-08-29 NOTE — Patient Instructions (Addendum)
Good to see you today, call us with questions. Blood pressure is staying high - start checking at home with cuff - if consistently >140/90, start blood pressure medicine called hydrochlorothiazide (water pill).  i've provided you with prescription for this. Return at your convenience fasting for blood work, return in 2 months for recheck (after your next appointment with Dr. Sherryll Burger).

## 2012-08-30 ENCOUNTER — Encounter: Payer: Self-pay | Admitting: Family Medicine

## 2012-08-30 DIAGNOSIS — I10 Essential (primary) hypertension: Secondary | ICD-10-CM | POA: Insufficient documentation

## 2012-08-30 DIAGNOSIS — Z8673 Personal history of transient ischemic attack (TIA), and cerebral infarction without residual deficits: Secondary | ICD-10-CM | POA: Insufficient documentation

## 2012-08-30 DIAGNOSIS — Z8579 Personal history of other malignant neoplasms of lymphoid, hematopoietic and related tissues: Secondary | ICD-10-CM | POA: Insufficient documentation

## 2012-08-30 NOTE — Assessment & Plan Note (Signed)
Possible clouded censorium from poorly controlled seizures - now much more stable. Consider MMSE next visit.

## 2012-08-30 NOTE — Assessment & Plan Note (Signed)
Stable on depakote and keppra - continue meds.  No seizures since new med started (keppra) Phenobarb was discontinued by neurology.

## 2012-08-30 NOTE — Assessment & Plan Note (Signed)
Has been persistently elevated recently.  Discussed this as well as recommended foods to avoid and eat. Provided with script for hydrochlorothiazide 12.5mg  daily - to start if at home consistently >140/90 Daughter says she will buy cuff for home use. BP Readings from Last 3 Encounters:  08/29/12 160/96  06/13/12 150/78  05/23/12 122/76

## 2012-08-30 NOTE — Assessment & Plan Note (Addendum)
On aspirin daily.  This dx found on imaging.

## 2012-08-30 NOTE — Assessment & Plan Note (Signed)
Back on aricept. Per daughter, neuro not concerned with dementia or memory impairment. Initial memory deficits could very well have been sequelae of uncontrolled seizure activity, and actually pt does seem more alert today compared to prior visits. I suggested they return to see me after next appt with Dr. Sherryll Burger and then will discussed anticipated level of care needed. Pt/daughter agree with plan.

## 2012-09-01 DIAGNOSIS — H2510 Age-related nuclear cataract, unspecified eye: Secondary | ICD-10-CM | POA: Diagnosis not present

## 2012-09-13 DIAGNOSIS — H259 Unspecified age-related cataract: Secondary | ICD-10-CM | POA: Diagnosis not present

## 2012-09-13 DIAGNOSIS — H269 Unspecified cataract: Secondary | ICD-10-CM | POA: Diagnosis not present

## 2012-09-13 DIAGNOSIS — H2510 Age-related nuclear cataract, unspecified eye: Secondary | ICD-10-CM | POA: Diagnosis not present

## 2012-09-29 DIAGNOSIS — G40209 Localization-related (focal) (partial) symptomatic epilepsy and epileptic syndromes with complex partial seizures, not intractable, without status epilepticus: Secondary | ICD-10-CM | POA: Diagnosis not present

## 2012-09-29 DIAGNOSIS — I69998 Other sequelae following unspecified cerebrovascular disease: Secondary | ICD-10-CM | POA: Diagnosis not present

## 2012-10-27 ENCOUNTER — Ambulatory Visit: Payer: Medicare Other | Admitting: Family Medicine

## 2012-10-27 ENCOUNTER — Encounter: Payer: Self-pay | Admitting: Family Medicine

## 2012-10-27 ENCOUNTER — Ambulatory Visit (INDEPENDENT_AMBULATORY_CARE_PROVIDER_SITE_OTHER): Payer: Medicare Other | Admitting: Family Medicine

## 2012-10-27 VITALS — BP 154/82 | HR 66 | Temp 97.8°F | Ht 63.5 in | Wt 120.5 lb

## 2012-10-27 DIAGNOSIS — I10 Essential (primary) hypertension: Secondary | ICD-10-CM | POA: Diagnosis not present

## 2012-10-27 DIAGNOSIS — R569 Unspecified convulsions: Secondary | ICD-10-CM | POA: Diagnosis not present

## 2012-10-27 DIAGNOSIS — E559 Vitamin D deficiency, unspecified: Secondary | ICD-10-CM

## 2012-10-27 DIAGNOSIS — R413 Other amnesia: Secondary | ICD-10-CM | POA: Diagnosis not present

## 2012-10-27 NOTE — Assessment & Plan Note (Signed)
Possibly sequelae post-seizure, however I remain concerned for early dementia.  Continue aricept for now. Daughter has not noticed concerns for dementia, but is also eager to return home. When she returns for medicare wellness visit, will need MMSE - will decide at that time safety in driving. Will also review latest note from Dr. Sherryll Burger.  May touch base with neuro after MMSE performed next visit.

## 2012-10-27 NOTE — Progress Notes (Signed)
  Subjective:    Patient ID: Hannah Howell, female    DOB: 30-Mar-1941, 72 y.o.   MRN: [id number]  HPI CC: 2 mo f/u  Presents with daughter today.  Daughter planning on returning home in June.  Lisl has good friend who lives nearby and can check on her intermittently.  HTN - states at home BP ranging 140-150/70-80.  No vision changes, CP/tightness, SOB, leg swelling.  Occasional headache. BP Readings from Last 3 Encounters:  10/27/12 154/82  08/29/12 160/96  06/13/12 150/78    Changed diet - eating healthy.  Tuna, vegetables, blueberries, etc. Wt Readings from Last 3 Encounters:  10/27/12 120 lb 8 oz (54.658 kg)  08/29/12 115 lb (52.164 kg)  06/13/12 117 lb 8 oz (53.298 kg)    Seizure disorder - compliant with keppra and divalproate.  Saw Dr. Sherryll Burger last month.  No f/u scheduled.  Told doing well. Daughter not at all concerned about dementia, states Dr Sherryll Burger not concerned either.  I have requested his latest note. Continues aricept 5mg  daily.  Past Medical History  Diagnosis Date  . Seizures since teen    following head trauma as 72 yo (Dr. Sherryll Burger, [facility])  . 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 Vitamin D deficiency   . History of stroke     on imaging - lacunar R and L pons and R occipital  . Idiopathic peripheral neuropathy   . History of chicken pox   . Hypertension     Review of Systems Per HPI    Objective:   Physical Exam  Nursing note and vitals reviewed. Constitutional: She appears well-developed and well-nourished. No distress.  HENT:  Head: Normocephalic and atraumatic.  Mouth/Throat: Oropharynx is clear and moist. No oropharyngeal exudate.  Eyes: Conjunctivae and EOM are normal. Pupils are equal, round, and reactive to light. No scleral icterus.  Neck: Normal range of motion. Neck supple. Carotid bruit is not present. No thyromegaly present.  Cardiovascular: Normal rate, regular rhythm, normal heart sounds and intact distal pulses.   No murmur  heard. Pulmonary/Chest: Effort normal and breath sounds normal. No respiratory distress. She has no wheezes. She has no rales.  Musculoskeletal: She exhibits no edema.  Lymphadenopathy:    She has no cervical adenopathy.  Neurological: She is alert.  Did not know day of week or date. Did not know location.  Skin: Skin is warm and dry. No rash noted.  Psychiatric: She has a normal mood and affect.       Assessment & Plan:

## 2012-10-27 NOTE — Assessment & Plan Note (Signed)
Encouraged restart vitamin D 

## 2012-10-27 NOTE — Assessment & Plan Note (Signed)
Had not started antihypertensive despite elevated readings at home. Start HCTZ daily.  Check blood work when returns. Has bp cuff at home.

## 2012-10-27 NOTE — Patient Instructions (Addendum)
Heart rate normal range between 60-100.  i'd prefer between 60-80. Goal blood pressure is <140/90 Start hydrochlorothiazide 12.5mg  daily. Restart vitamin D 1000 units daily. Return in 1-2 months fasting for blood work, afterwards for physical (medicare wellness visit).

## 2012-10-27 NOTE — Assessment & Plan Note (Signed)
Stable on depakote and keppra - continue. No seizures in last several months. Discussed no driiving for at least 6 mo after last seizure. I have requested latest neurology office note.

## 2012-11-29 ENCOUNTER — Other Ambulatory Visit (INDEPENDENT_AMBULATORY_CARE_PROVIDER_SITE_OTHER): Payer: Medicare Other

## 2012-11-29 DIAGNOSIS — I6350 Cerebral infarction due to unspecified occlusion or stenosis of unspecified cerebral artery: Secondary | ICD-10-CM | POA: Diagnosis not present

## 2012-11-29 DIAGNOSIS — E559 Vitamin D deficiency, unspecified: Secondary | ICD-10-CM | POA: Diagnosis not present

## 2012-11-29 DIAGNOSIS — I639 Cerebral infarction, unspecified: Secondary | ICD-10-CM

## 2012-11-29 LAB — BASIC METABOLIC PANEL
BUN: 19 mg/dL (ref 6–23)
CO2: 31 mEq/L (ref 19–32)
Chloride: 103 mEq/L (ref 96–112)
Creatinine, Ser: 0.9 mg/dL (ref 0.4–1.2)
Glucose, Bld: 85 mg/dL (ref 70–99)

## 2012-11-29 LAB — LIPID PANEL
Cholesterol: 200 mg/dL (ref 0–200)
Triglycerides: 95 mg/dL (ref 0.0–149.0)

## 2012-11-30 NOTE — Addendum Note (Signed)
Addended by: Alvina Chou on: 11/30/2012 09:49 AM   Modules accepted: Orders

## 2012-12-01 ENCOUNTER — Encounter: Payer: Medicare Other | Admitting: Family Medicine

## 2012-12-02 ENCOUNTER — Telehealth: Payer: Self-pay | Admitting: *Deleted

## 2012-12-02 NOTE — Telephone Encounter (Signed)
Patient has talked to her son and is convinced she has cancer. He wants to know if this was tested for. I advised this is not routinely tested for and that if she has concerns they will be dicussed at her physical and tests will be ordered if necessary.

## 2012-12-03 NOTE — Telephone Encounter (Addendum)
Will discuss with patient at visit H/o lymphoma but weight stable.  Lab Results  Component Value Date   WBC 8.0 05/20/2012   HGB 12.7 05/20/2012   HCT 36.3 05/20/2012   MCV 95.3 05/20/2012   PLT 371 05/20/2012    Past Medical History  Diagnosis Date  . Seizures since teen    following head trauma as 72 yo (Dr. Sherryll Burger, [facility])  . 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 Vitamin D deficiency   . History of stroke     on imaging - lacunar R and L pons and R occipital  . Idiopathic peripheral neuropathy   . History of chicken pox   . Hypertension     Wt Readings from Last 3 Encounters:  10/27/12 120 lb 8 oz (54.658 kg)  08/29/12 115 lb (52.164 kg)  06/13/12 117 lb 8 oz (53.298 kg)

## 2012-12-05 ENCOUNTER — Encounter: Payer: Self-pay | Admitting: Family Medicine

## 2012-12-05 ENCOUNTER — Other Ambulatory Visit (HOSPITAL_COMMUNITY)
Admission: RE | Admit: 2012-12-05 | Discharge: 2012-12-05 | Disposition: A | Discharge status: Home / Self Care | Payer: Medicare Other | Source: Ambulatory Visit | Attending: Certified Registered Nurse Anesthetist | Admitting: Certified Registered Nurse Anesthetist

## 2012-12-05 ENCOUNTER — Ambulatory Visit (INDEPENDENT_AMBULATORY_CARE_PROVIDER_SITE_OTHER): Payer: Medicare Other | Admitting: Family Medicine

## 2012-12-05 VITALS — BP 132/82 | HR 76 | Temp 97.8°F | Ht 64.0 in | Wt 123.5 lb

## 2012-12-05 DIAGNOSIS — Z1211 Encounter for screening for malignant neoplasm of colon: Secondary | ICD-10-CM | POA: Diagnosis not present

## 2012-12-05 DIAGNOSIS — Z01419 Encounter for gynecological examination (general) (routine) without abnormal findings: Secondary | ICD-10-CM

## 2012-12-05 DIAGNOSIS — R319 Hematuria, unspecified: Secondary | ICD-10-CM

## 2012-12-05 DIAGNOSIS — Z87898 Personal history of other specified conditions: Secondary | ICD-10-CM

## 2012-12-05 DIAGNOSIS — R300 Dysuria: Secondary | ICD-10-CM

## 2012-12-05 DIAGNOSIS — Z0000 Encounter for general adult medical examination without abnormal findings: Secondary | ICD-10-CM | POA: Diagnosis not present

## 2012-12-05 DIAGNOSIS — Z1151 Encounter for screening for human papillomavirus (HPV): Secondary | ICD-10-CM | POA: Diagnosis not present

## 2012-12-05 DIAGNOSIS — Z8579 Personal history of other malignant neoplasms of lymphoid, hematopoietic and related tissues: Secondary | ICD-10-CM

## 2012-12-05 DIAGNOSIS — E559 Vitamin D deficiency, unspecified: Secondary | ICD-10-CM

## 2012-12-05 DIAGNOSIS — I10 Essential (primary) hypertension: Secondary | ICD-10-CM

## 2012-12-05 DIAGNOSIS — R569 Unspecified convulsions: Secondary | ICD-10-CM

## 2012-12-05 DIAGNOSIS — R413 Other amnesia: Secondary | ICD-10-CM

## 2012-12-05 LAB — POCT URINALYSIS DIPSTICK
Bilirubin, UA: NEGATIVE
Ketones, UA: NEGATIVE
Leukocytes, UA: NEGATIVE
Spec Grav, UA: >=1.03
pH, UA: 6

## 2012-12-05 NOTE — Progress Notes (Signed)
Subjective:    Patient ID: Hannah Howell, female    DOB: 1940/07/23, 72 y.o.   MRN: [id number]  HPI CC: medicare  Presents with daughter today.  No questions or concerns today. Wt Readings from Last 3 Encounters:  12/05/12 123 lb 8 oz (56.019 kg)  10/27/12 120 lb 8 oz (54.658 kg)  08/29/12 115 lb (52.164 kg)  weight gain noted recently.  Persistent hoarse voice for last few weeks. Seizures - on keppra bid, divalproate tid.  No seizures in last 6+ months. Persistent concerns with dementia - bought brain games books and has been doing, finds helps with memory. H/o lymphoma s/p partial gastrectomy and splenectomy. Daughter planning on moving back to [place] in next few weeks. Significant stress with having daughter living in same house.  Noticed had some bleeding a few days ago after wiping in front (groin).  Denies abd pain.  Some possible dysuria and increased urgency.  No BM changes.  Denies vaginal discharge.  Passes vision exam. Failed hearing L side low frequency.  Denies falls or depression/anhedonia, sadness.  Loves painting and dancing.  Preventative: Last CPE unsure. No recent well woman exam. Colon cancer screening - has not had.  Requests colonoscopy.  Requests to schedule mammogram. DEXA - not recently Flu - 05/2012 Pneumovax - 06/09/2012 Tetanus - unsure Shingles shot - unsure - discussed. Advanced directives: currently not in a place to decide 2/2 stress with living with daughter. Wants to further discuss in future.  Medications and allergies reviewed and updated in chart.  Past histories reviewed and updated if relevant as below. Patient Active Problem List   Diagnosis Date Noted  . History of CVA (cerebrovascular accident) 08/30/2012  . Hypertension   . History of lymphoma   . Vitamin D deficiency   . Right arm pain 05/26/2012  . Memory deficit 05/20/2012  . Seizures    Past Medical History  Diagnosis Date  . Seizures since teen    following head  trauma as 72 yo (Dr. Sherryll Burger, [facility])  . 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 Vitamin D deficiency   . History of stroke     on imaging - lacunar R and L pons and R occipital  . Idiopathic peripheral neuropathy   . History of chicken pox   . Hypertension    Past Surgical History  Procedure Laterality Date  . 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 Partial gastrectomy  2004    lymphoma   History  Substance Use Topics  . Smoking status: Former Games developer  . Smokeless tobacco: Never Used  . Alcohol Use: No   Family History  Problem Relation Age of Onset  . Diabetes    . Alcohol abuse Father   . CAD Mother     MI   No Known Allergies Current Outpatient Prescriptions on File Prior to Visit  Medication Sig Dispense Refill  . aspirin 81 MG tablet Take 81 mg by mouth daily.      . Calcium Carb-Cholecalciferol (CALCIUM-VITAMIN D) 600-400 MG-UNIT TABS Take 1 tablet by mouth 2 (two) times daily.      . cholecalciferol (VITAMIN D) 1000 UNITS tablet Take 1,000 Units by mouth daily.      . divalproex (DEPAKOTE) 250 MG DR tablet Take 1 tablet (250 mg total) by mouth 3 (three) times daily.      Marland Kitchen donepezil (ARICEPT) 5 MG tablet Take 5 mg by mouth daily.      Marland Kitchen  fish oil-omega-3 fatty acids 1000 MG capsule Take 1 g by mouth daily.      . hydrochlorothiazide (HYDRODIURIL) 12.5 MG tablet Take 1 tablet (12.5 mg total) by mouth daily.  30 tablet  6  . levETIRAcetam (KEPPRA) 500 MG tablet Take 500 mg by mouth every 12 (twelve) hours.      . vitamin B-12 (CYANOCOBALAMIN) 1000 MCG tablet Take 1,000 mcg by mouth daily.       No current facility-administered medications on file prior to visit.     Review of Systems  Constitutional: Negative for fever, chills, activity change, appetite change, fatigue and unexpected weight change.       Good energy level, no night sweats, no weight loss  HENT: Negative for hearing loss and neck pain.   Eyes: Negative for visual disturbance.  Respiratory: Negative for  cough, chest tightness, shortness of breath and wheezing.   Cardiovascular: Negative for chest pain, palpitations and leg swelling.  Gastrointestinal: Negative for nausea, vomiting, abdominal pain, diarrhea, constipation, blood in stool and abdominal distention.  Genitourinary: Negative for hematuria and difficulty urinating.  Musculoskeletal: Negative for myalgias and arthralgias.  Skin: Negative for rash.  Neurological: Negative for dizziness, seizures, syncope and headaches.  Hematological: Negative for adenopathy. Does not bruise/bleed easily.  Psychiatric/Behavioral: Negative for dysphoric mood. The patient is not nervous/anxious.        Objective:   Physical Exam  Nursing note and vitals reviewed. Constitutional: She is oriented to person, place, and time. She appears well-developed and well-nourished. No distress.  HENT:  Head: Normocephalic and atraumatic.  Mouth/Throat: Oropharynx is clear and moist. No oropharyngeal exudate.  Eyes: Conjunctivae and EOM are normal. Pupils are equal, round, and reactive to light. No scleral icterus.  Neck: Normal range of motion. Neck supple. Carotid bruit is not present.  Cardiovascular: Normal rate, regular rhythm, normal heart sounds and intact distal pulses.   No murmur heard. Pulses:      Radial pulses are 2+ on the right side, and 2+ on the left side.  Pulmonary/Chest: Effort normal and breath sounds normal. No respiratory distress. She has no wheezes. She has no rales. Right breast exhibits no inverted nipple, no mass, no nipple discharge, no skin change and no tenderness. Left breast exhibits no inverted nipple, no mass, no nipple discharge, no skin change and no tenderness. Breasts are symmetrical.  Genitourinary: Vagina normal and uterus normal. Pelvic exam was performed with patient supine. There is no rash, tenderness, lesion or injury on the right labia. There is no rash, tenderness, lesion or injury on the left labia. Cervix exhibits  no motion tenderness, no discharge and no friability. Right adnexum displays no mass, no tenderness and no fullness. Left adnexum displays no mass, no tenderness and no fullness.  Slight erythema of cervix, no blood in vaginal vault  Musculoskeletal: Normal range of motion. She exhibits no edema.  Lymphadenopathy:       Head (right side): No submental, no submandibular, no tonsillar, no preauricular, no posterior auricular and no occipital adenopathy present.       Head (left side): No submental, no submandibular, no tonsillar, no preauricular, no posterior auricular and no occipital adenopathy present.    She has no cervical adenopathy.    She has no axillary adenopathy.       Right axillary: No lateral adenopathy present.       Left axillary: No lateral adenopathy present.      Right: No inguinal and no supraclavicular adenopathy present.  Left: No inguinal and no supraclavicular adenopathy present.  Neurological: She is alert and oriented to person, place, and time.  CN grossly intact, station and gait intact  Skin: Skin is warm and dry. No rash noted.  Psychiatric: She has a normal mood and affect. Her behavior is normal. Judgment and thought content normal.       Assessment & Plan:

## 2012-12-05 NOTE — Patient Instructions (Signed)
Call your insurance about the shingles shot to see if it is covered or how much it would cost and where is cheaper (here or pharmacy).  If you want to receive here, call for nurse visit.  Schedule appointment with eye doctor for follow up. Stool kit today. Pass by [facility]'s office for referral to GI doctor for colonoscopy. Call to schedule mammogram. Urine showing some blood.  I will send off urine culture to check for infection.  We will call you with results of urine culture. Good to see you today, call us with questions.

## 2012-12-06 ENCOUNTER — Encounter: Payer: Self-pay | Admitting: Gastroenterology

## 2012-12-06 ENCOUNTER — Encounter: Payer: Self-pay | Admitting: Family Medicine

## 2012-12-06 DIAGNOSIS — R319 Hematuria, unspecified: Secondary | ICD-10-CM | POA: Insufficient documentation

## 2012-12-06 DIAGNOSIS — I69919 Unspecified symptoms and signs involving cognitive functions following unspecified cerebrovascular disease: Secondary | ICD-10-CM | POA: Diagnosis not present

## 2012-12-06 DIAGNOSIS — R569 Unspecified convulsions: Secondary | ICD-10-CM | POA: Diagnosis not present

## 2012-12-06 DIAGNOSIS — Z0000 Encounter for general adult medical examination without abnormal findings: Secondary | ICD-10-CM | POA: Insufficient documentation

## 2012-12-06 NOTE — Assessment & Plan Note (Signed)
Great control on low dose HCTZ.

## 2012-12-06 NOTE — Assessment & Plan Note (Signed)
I have personally reviewed the Medicare Annual Wellness questionnaire and have noted 1. The patient's medical and social history 2. Their use of alcohol, tobacco or illicit drugs 3. Their current medications and supplements 4. The patient's functional ability including ADL's, fall risks, home safety risks and hearing or visual impairment. 5. Diet and physical activity 6. Evidence for depression or mood disorders The patients weight, height, BMI have been recorded in the chart.  Hearing and vision has been addressed. I have made referrals, counseling and provided education to the patient based review of the above and I have provided the pt with a written personalized care plan for preventive services. See scanned questionairre. Advanced directives discussed: will discuss at next wellness visit.  Reviewed preventative protocols and updated unless pt declined. Discussed shingles shot - pt will check for insurance coverage. Recommended schedule mammogram. Pap performed today. Will set up for colonoscopy.

## 2012-12-06 NOTE — Assessment & Plan Note (Signed)
WNL. Continue supplementation.

## 2012-12-06 NOTE — Assessment & Plan Note (Signed)
Possible infectious cause - I have sent urine culture.  If positive, will treat.  If negative, may refer to urology for hematuria workup. No vaginal source of bleed.

## 2012-12-06 NOTE — Assessment & Plan Note (Signed)
Stable on depakote/keppra. Cleared for driving as >6 mo since last seizure per patient.

## 2012-12-06 NOTE — Assessment & Plan Note (Signed)
Denies constitutional sxs.  Check CBC next blood draw.  Pap performed today.  rec schedule mammogram and colonoscopy.

## 2012-12-06 NOTE — Assessment & Plan Note (Signed)
MMSE today 24-27/30; failed 3 orientation questions, 3 recall (although got all 3 with cues), and 0 attention (with spelling and serial 3s) or 2 attention (with serial 7s). Main trouble seems with memory, albeit mild trouble. I remain concerned for very early dementia.  Will continue to monitor. I suggested she is ok to drive short distances locally, but will need to have someone with her to drive any long distances (ie upcoming trip to [place]). She will be staying locally - I recommended she continue to follow with Dr. Sherryll Burger for seizures and for continued monitoring for evidence of dementia.

## 2012-12-09 ENCOUNTER — Encounter: Payer: Self-pay | Admitting: *Deleted

## 2012-12-13 ENCOUNTER — Encounter: Payer: Self-pay | Admitting: *Deleted

## 2013-01-16 ENCOUNTER — Encounter: Payer: Self-pay | Admitting: Gastroenterology

## 2013-01-19 DIAGNOSIS — T148XXA Other injury of unspecified body region, initial encounter: Secondary | ICD-10-CM | POA: Diagnosis not present

## 2013-01-24 ENCOUNTER — Ambulatory Visit (AMBULATORY_SURGERY_CENTER): Payer: Medicare Other | Admitting: *Deleted

## 2013-01-24 VITALS — Ht 64.0 in | Wt 124.0 lb

## 2013-01-24 DIAGNOSIS — R443 Hallucinations, unspecified: Secondary | ICD-10-CM | POA: Diagnosis not present

## 2013-01-24 DIAGNOSIS — Z1211 Encounter for screening for malignant neoplasm of colon: Secondary | ICD-10-CM

## 2013-01-24 MED ORDER — NA SULFATE-K SULFATE-MG SULF 17.5-3.13-1.6 GM/177ML PO SOLN: 1.0000 | Freq: Once | ORAL | Status: DC

## 2013-01-24 NOTE — Progress Notes (Signed)
No egg or soy product allergy. ewm No home 02 use. ewm Pt states no previous colonoscopy. ewm No problems with past sedation. ewm Pt does have hx of seizures. Pt unaware of last episode. ewm

## 2013-01-26 DIAGNOSIS — E559 Vitamin D deficiency, unspecified: Secondary | ICD-10-CM | POA: Diagnosis not present

## 2013-01-26 DIAGNOSIS — R413 Other amnesia: Secondary | ICD-10-CM | POA: Diagnosis not present

## 2013-01-30 ENCOUNTER — Encounter: Payer: Self-pay | Admitting: Gastroenterology

## 2013-01-30 ENCOUNTER — Ambulatory Visit (AMBULATORY_SURGERY_CENTER): Payer: Medicare Other | Admitting: Gastroenterology

## 2013-01-30 VITALS — BP 125/71 | HR 65 | Temp 97.3°F | Resp 24 | Ht 64.0 in | Wt 124.0 lb

## 2013-01-30 DIAGNOSIS — Z1211 Encounter for screening for malignant neoplasm of colon: Secondary | ICD-10-CM

## 2013-01-30 DIAGNOSIS — R319 Hematuria, unspecified: Secondary | ICD-10-CM | POA: Diagnosis not present

## 2013-01-30 DIAGNOSIS — K5730 Diverticulosis of large intestine without perforation or abscess without bleeding: Secondary | ICD-10-CM

## 2013-01-30 DIAGNOSIS — I10 Essential (primary) hypertension: Secondary | ICD-10-CM | POA: Diagnosis not present

## 2013-01-30 MED ORDER — SODIUM CHLORIDE 0.9 % IV SOLN: 500.0000 mL | INTRAVENOUS | Status: DC

## 2013-01-30 NOTE — Progress Notes (Signed)
Patient did not experience any of the following events: a burn prior to discharge; a fall within the facility; wrong site/side/patient/procedure/implant event; or a hospital transfer or hospital admission upon discharge from the facility. (G8907) Patient did not have preoperative order for IV antibiotic SSI prophylaxis. (G8918)  

## 2013-01-30 NOTE — Patient Instructions (Addendum)
Discharge instructions given with verbal understanding. Handouts on diverticulosis and a high fiber diet given. Resume previous medications.YOU HAD AN ENDOSCOPIC PROCEDURE TODAY AT THE [facility]: Refer to the procedure report that was given to you for any specific questions about what was found during the examination.  If the procedure report does not answer your questions, please call your gastroenterologist to clarify.  If you requested that your care partner not be given the details of your procedure findings, then the procedure report has been included in a sealed envelope for you to review at your convenience later.  YOU SHOULD EXPECT: Some feelings of bloating in the abdomen. Passage of more gas than usual.  Walking can help get rid of the air that was put into your GI tract during the procedure and reduce the bloating. If you had a lower endoscopy (such as a colonoscopy or flexible sigmoidoscopy) you may notice spotting of blood in your stool or on the toilet paper. If you underwent a bowel prep for your procedure, then you may not have a normal bowel movement for a few days.  DIET: Your first meal following the procedure should be a light meal and then it is ok to progress to your normal diet.  A half-sandwich or bowl of soup is an example of a good first meal.  Heavy or fried foods are harder to digest and may make you feel nauseous or bloated.  Likewise meals heavy in dairy and vegetables can cause extra gas to form and this can also increase the bloating.  Drink plenty of fluids but you should avoid alcoholic beverages for 24 hours.  ACTIVITY: Your care partner should take you home directly after the procedure.  You should plan to take it easy, moving slowly for the rest of the day.  You can resume normal activity the day after the procedure however you should NOT DRIVE or use heavy machinery for 24 hours (because of the sedation medicines used during the test).    SYMPTOMS TO  REPORT IMMEDIATELY: A gastroenterologist can be reached at any hour.  During normal business hours, 8:30 AM to 5:00 PM Monday through Friday, call [phone].  After hours and on weekends, please call the GI answering service at [phone] who will take a message and have the physician on call contact you.   Following lower endoscopy (colonoscopy or flexible sigmoidoscopy):  Excessive amounts of blood in the stool  Significant tenderness or worsening of abdominal pains  Swelling of the abdomen that is new, acute  Fever of 100F or higher  FOLLOW UP: If any biopsies were taken you will be contacted by phone or by letter within the next 1-3 weeks.  Call your gastroenterologist if you have not heard about the biopsies in 3 weeks.  Our staff will call the home number listed on your records the next business day following your procedure to check on you and address any questions or concerns that you may have at that time regarding the information given to you following your procedure. This is a courtesy call and so if there is no answer at the home number and we have not heard from you through the emergency physician on call, we will assume that you have returned to your regular daily activities without incident.  SIGNATURES/CONFIDENTIALITY: You and/or your care partner have signed paperwork which will be entered into your electronic medical record.  These signatures attest to the fact that that the information above on your   After Visit Summary has been reviewed and is understood.  Full responsibility of the confidentiality of this discharge information lies with you and/or your care-partner.   

## 2013-01-30 NOTE — Op Note (Signed)
[facility] [address] [place], [address]   COLONOSCOPY PROCEDURE REPORT  PATIENT: Hannah, Howell  MR#: [id number] BIRTHDATE: 1940/09/26 , 72  yrs. old GENDER: Female ENDOSCOPIST: Louis Meckel, MD REFERRED XB:JYNWGN Gutierrez, M.D. PROCEDURE DATE:  01/30/2013 PROCEDURE:   Colonoscopy, diagnostic ASA CLASS:   Class II INDICATIONS:average risk screening. MEDICATIONS: MAC sedation, administered by CRNA and propofol (Diprivan) 200mg  IV  DESCRIPTION OF PROCEDURE:   After the risks benefits and alternatives of the procedure were thoroughly explained, informed consent was obtained.  A digital rectal exam revealed no abnormalities of the rectum.   The LB [id number]  endoscope was introduced through the anus and advanced to the cecum, which was identified by both the appendix and ileocecal valve. No adverse events experienced.   The quality of the prep was excellent using Suprep  The instrument was then slowly withdrawn as the colon was fully examined.      COLON FINDINGS: Mild diverticulosis was noted in the sigmoid colon. The colon mucosa was otherwise normal.  Retroflexed views revealed no abnormalities. The time to cecum=4 minutes 43 seconds. Withdrawal time=6 minutes 40 seconds.  The scope was withdrawn and the procedure completed. COMPLICATIONS: There were no complications.  ENDOSCOPIC IMPRESSION: 1.   Mild diverticulosis was noted in the sigmoid colon 2.   The colon mucosa was otherwise normal  RECOMMENDATIONS: Continue current colorectal screening recommendations for "routine risk" patients with a repeat colonoscopy in 10 years.   eSigned:  Louis Meckel, MD 01/30/2013 11:03 AM   cc:

## 2013-01-31 ENCOUNTER — Telehealth: Payer: Self-pay | Admitting: *Deleted

## 2013-01-31 NOTE — Telephone Encounter (Signed)
  Follow up Call-  Call back number 01/30/2013  Post procedure Call Back phone  # [phone]  Permission to leave phone message Yes     Patient questions:  Do you have a fever, pain , or abdominal swelling? no Pain Score  0 *  Have you tolerated food without any problems? yes  Have you been able to return to your normal activities? yes  Do you have any questions about your discharge instructions: Diet   no Medications  no Follow up visit  no  Do you have questions or concerns about your Care? no  Actions: * If pain score is 4 or above: No action needed, pain <4.

## 2013-02-02 ENCOUNTER — Ambulatory Visit: Payer: Self-pay | Admitting: Neurology

## 2013-02-02 DIAGNOSIS — F0390 Unspecified dementia without behavioral disturbance: Secondary | ICD-10-CM | POA: Diagnosis not present

## 2013-02-02 DIAGNOSIS — R930 Abnormal findings on diagnostic imaging of skull and head, not elsewhere classified: Secondary | ICD-10-CM | POA: Diagnosis not present

## 2013-02-02 DIAGNOSIS — R443 Hallucinations, unspecified: Secondary | ICD-10-CM | POA: Diagnosis not present

## 2013-02-03 HISTORY — PX: COLONOSCOPY: SHX174

## 2013-02-09 DIAGNOSIS — R404 Transient alteration of awareness: Secondary | ICD-10-CM | POA: Diagnosis not present

## 2013-02-15 ENCOUNTER — Encounter: Payer: Self-pay | Admitting: Family Medicine

## 2013-03-03 DIAGNOSIS — R443 Hallucinations, unspecified: Secondary | ICD-10-CM | POA: Diagnosis not present

## 2013-03-03 DIAGNOSIS — R9401 Abnormal electroencephalogram [EEG]: Secondary | ICD-10-CM | POA: Diagnosis not present

## 2013-03-03 DIAGNOSIS — F22 Delusional disorders: Secondary | ICD-10-CM | POA: Diagnosis not present

## 2013-03-16 ENCOUNTER — Other Ambulatory Visit: Payer: Self-pay | Admitting: *Deleted

## 2013-03-16 MED ORDER — HYDROCHLOROTHIAZIDE 12.5 MG PO TABS: 12.5000 mg | ORAL_TABLET | Freq: Every day | ORAL | Status: DC

## 2013-04-03 DIAGNOSIS — H353190 Nonexudative age-related macular degeneration, unspecified eye, stage unspecified: Secondary | ICD-10-CM | POA: Diagnosis not present

## 2013-04-03 DIAGNOSIS — Z961 Presence of intraocular lens: Secondary | ICD-10-CM | POA: Diagnosis not present

## 2013-07-13 ENCOUNTER — Ambulatory Visit: Payer: Medicare Other | Admitting: Family Medicine

## 2013-07-13 DIAGNOSIS — Z0289 Encounter for other administrative examinations: Secondary | ICD-10-CM

## 2013-07-20 ENCOUNTER — Ambulatory Visit: Payer: Self-pay

## 2013-07-20 DIAGNOSIS — J040 Acute laryngitis: Secondary | ICD-10-CM | POA: Diagnosis not present

## 2013-07-20 DIAGNOSIS — R569 Unspecified convulsions: Secondary | ICD-10-CM | POA: Diagnosis not present

## 2013-07-20 DIAGNOSIS — I10 Essential (primary) hypertension: Secondary | ICD-10-CM | POA: Diagnosis not present

## 2013-07-20 DIAGNOSIS — R319 Hematuria, unspecified: Secondary | ICD-10-CM | POA: Diagnosis not present

## 2013-07-20 DIAGNOSIS — N390 Urinary tract infection, site not specified: Secondary | ICD-10-CM | POA: Diagnosis not present

## 2013-07-20 DIAGNOSIS — Z9089 Acquired absence of other organs: Secondary | ICD-10-CM | POA: Diagnosis not present

## 2013-07-20 DIAGNOSIS — Z79899 Other long term (current) drug therapy: Secondary | ICD-10-CM | POA: Diagnosis not present

## 2013-07-20 LAB — URINALYSIS, COMPLETE
BILIRUBIN, UR: NEGATIVE
Glucose,UR: NEGATIVE mg/dL (ref 0–75)
Ketone: NEGATIVE
Leukocyte Esterase: NEGATIVE
NITRITE: NEGATIVE
PH: 6 (ref 4.5–8.0)
PROTEIN: NEGATIVE
Specific Gravity: 1.025 (ref 1.003–1.030)

## 2013-07-20 LAB — RAPID INFLUENZA A&B ANTIGENS (ARMC ONLY)

## 2013-07-22 LAB — URINE CULTURE

## 2013-10-21 IMAGING — CR DG SHOULDER 2+V*R*
3 series · 3 of 3 positions shown · non-contrast
Comparison: None.

CLINICAL DATA: Right arm pain, shoulder pain

RIGHT SHOULDER - 2+ VIEW

[view not recorded (1 of 3)]
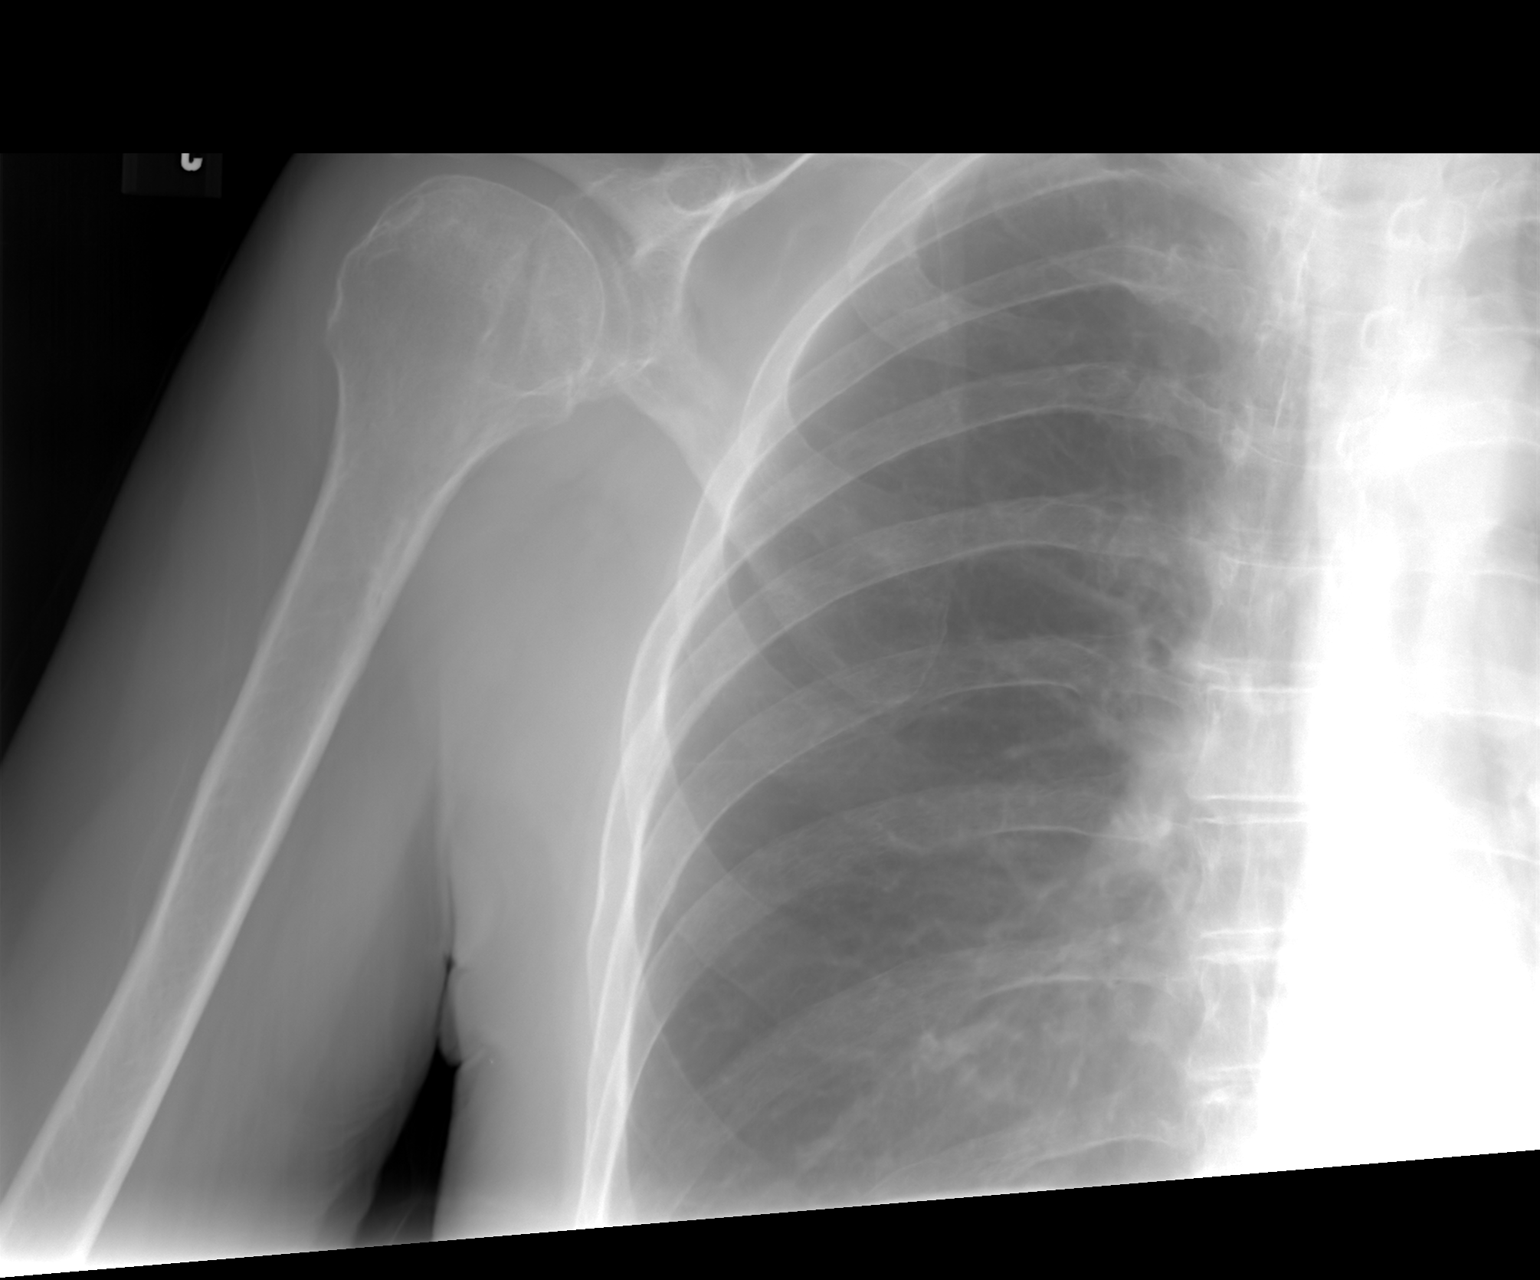

[view not recorded (2 of 3)]
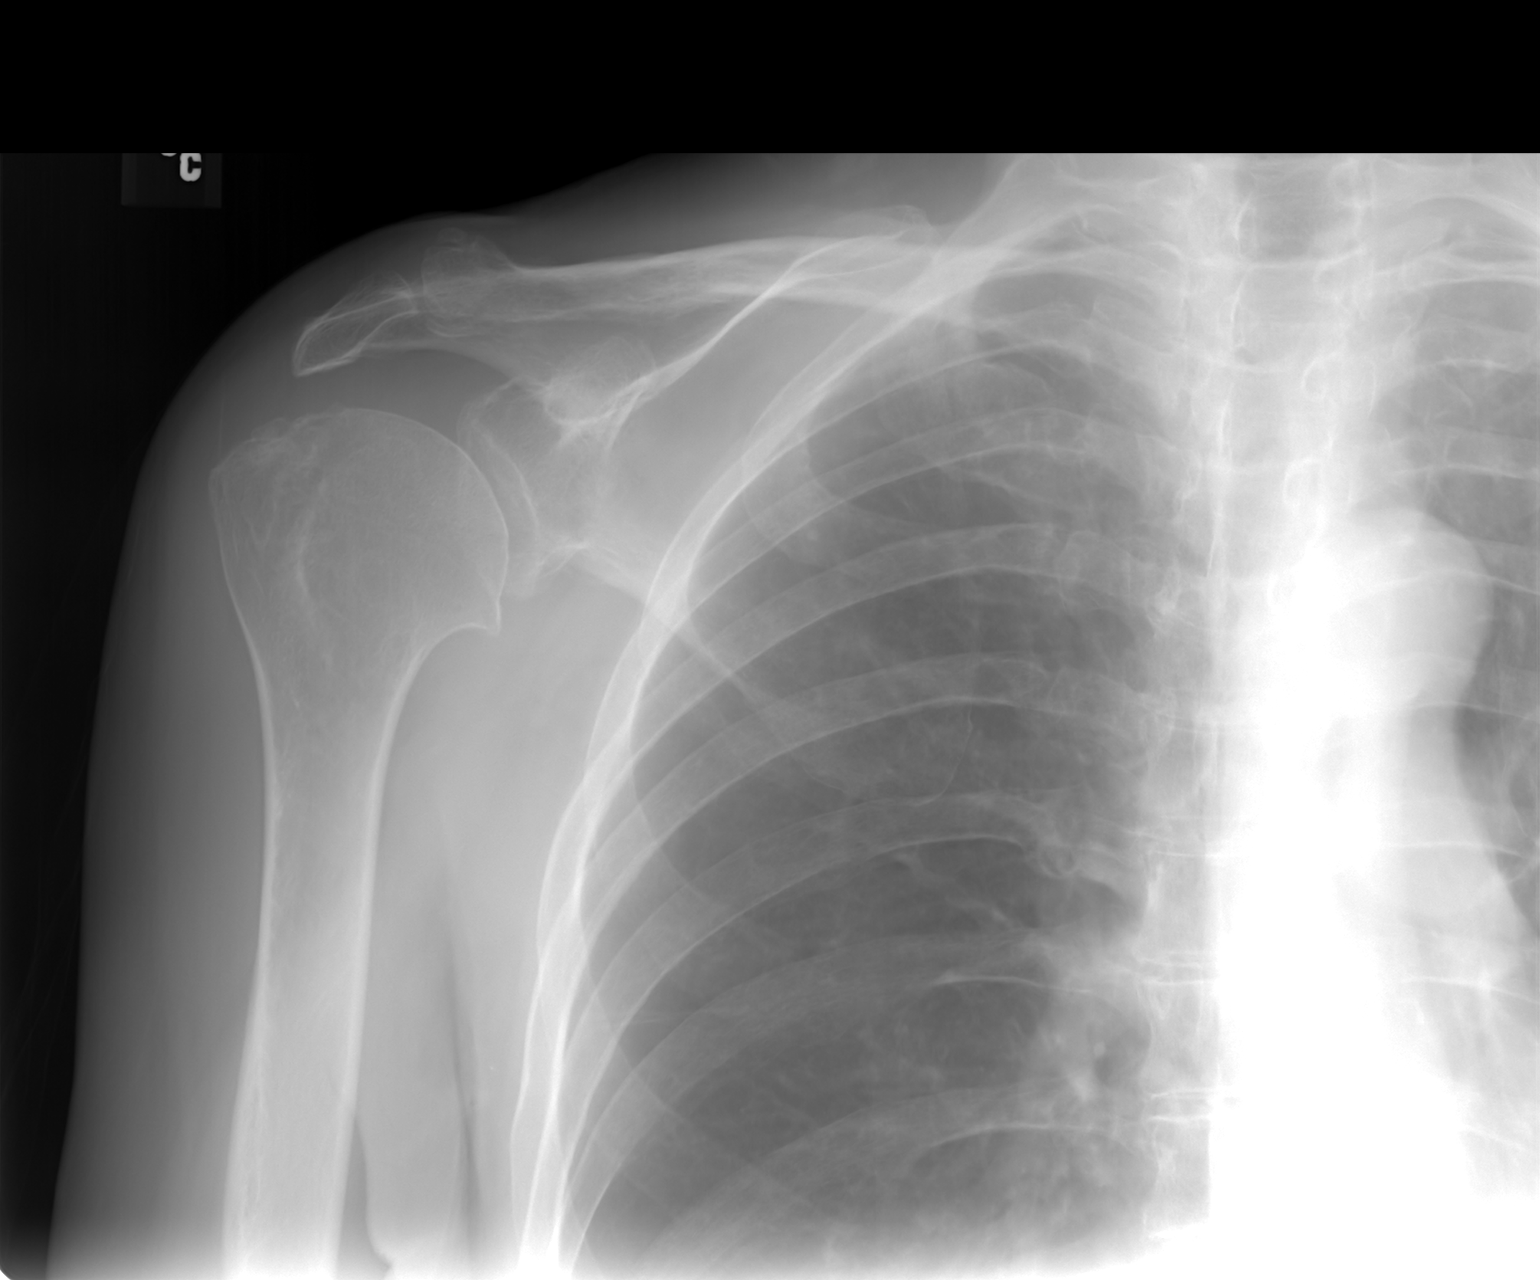

[view not recorded (3 of 3)]
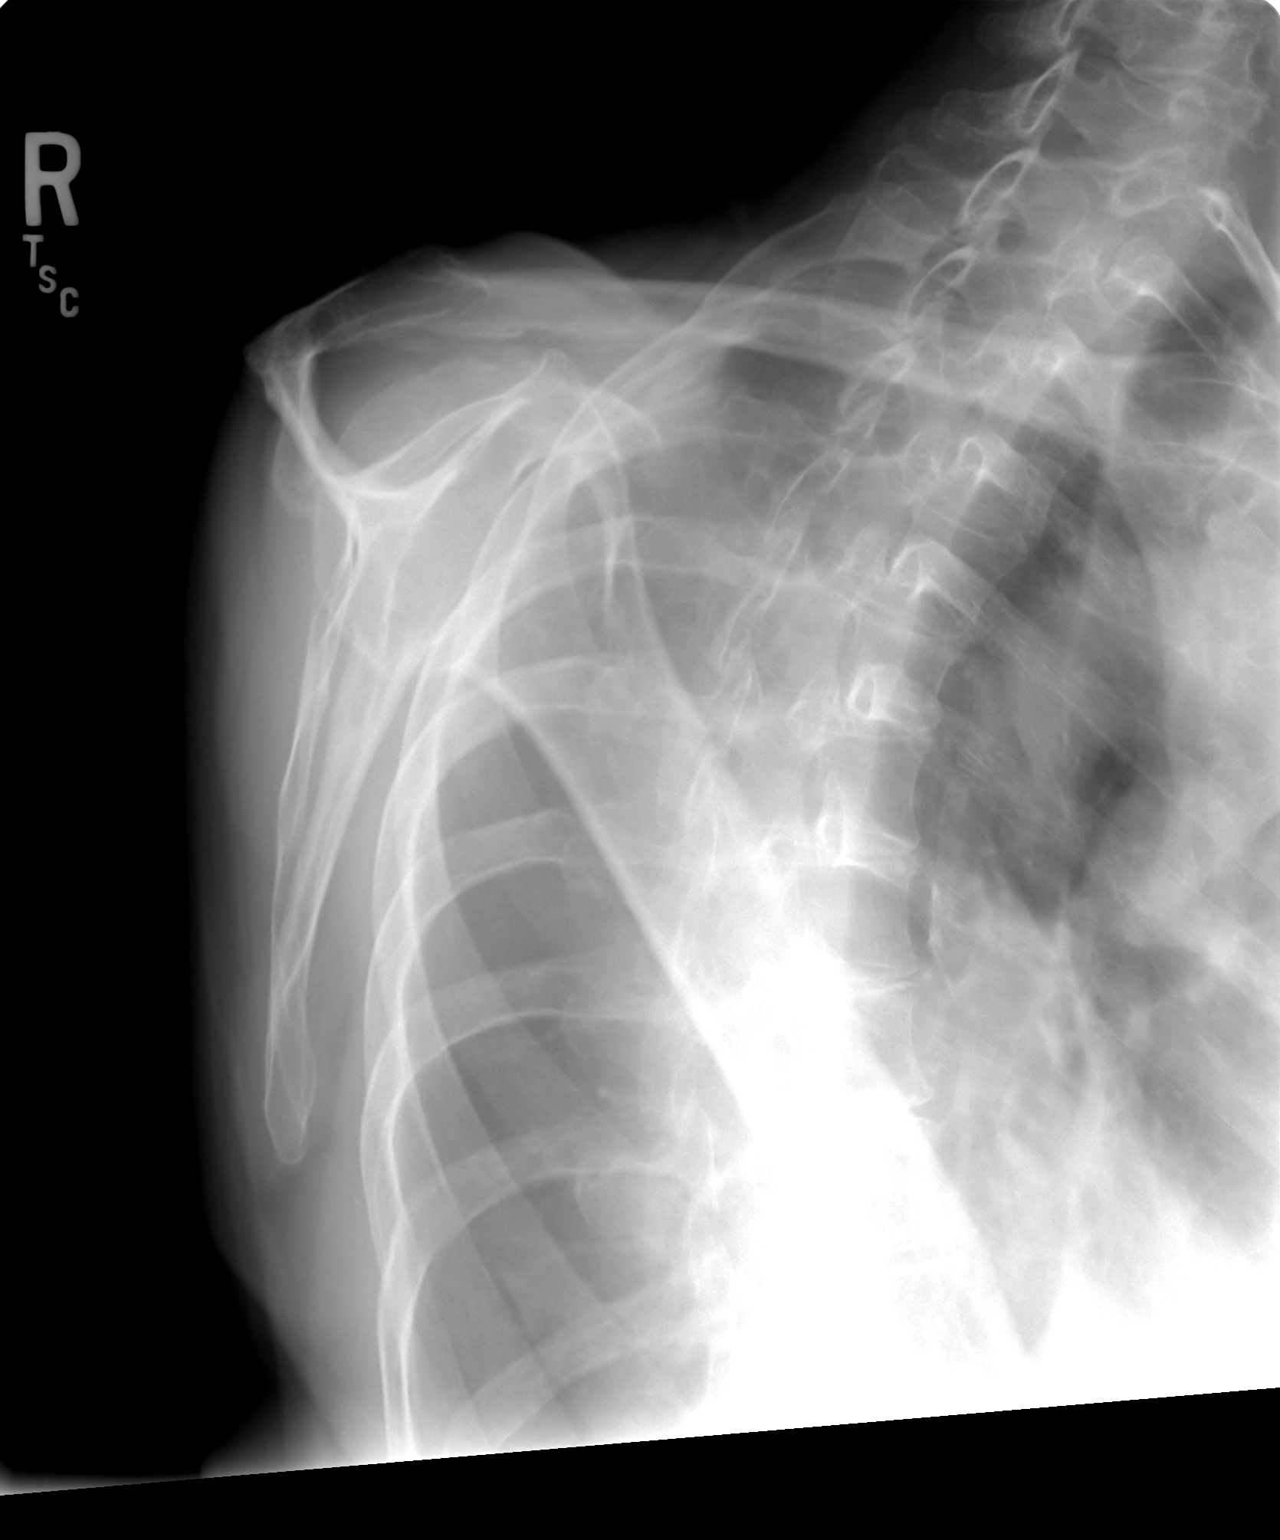

[3 of 3 positions shown; findings below may reference images not displayed]

FINDINGS: Degenerative arthritis of the right glenohumeral and AC
joints.  Bones are osteopenic.  No fracture or malalignment.  No
subluxation or dislocation.  Right upper lobe clear.  Visualized
right ribs intact.
IMPRESSION: Right shoulder degenerative changes.  No acute osseous finding.

## 2013-11-09 ENCOUNTER — Other Ambulatory Visit: Payer: Self-pay | Admitting: *Deleted

## 2013-11-09 MED ORDER — HYDROCHLOROTHIAZIDE 12.5 MG PO TABS: ORAL_TABLET | ORAL | Status: DC

## 2013-12-06 DIAGNOSIS — R569 Unspecified convulsions: Secondary | ICD-10-CM | POA: Diagnosis not present

## 2013-12-06 DIAGNOSIS — R5381 Other malaise: Secondary | ICD-10-CM | POA: Diagnosis not present

## 2013-12-06 DIAGNOSIS — R269 Unspecified abnormalities of gait and mobility: Secondary | ICD-10-CM | POA: Diagnosis not present

## 2013-12-06 DIAGNOSIS — R5383 Other fatigue: Secondary | ICD-10-CM | POA: Diagnosis not present

## 2013-12-06 DIAGNOSIS — R51 Headache: Secondary | ICD-10-CM | POA: Diagnosis not present

## 2013-12-11 ENCOUNTER — Other Ambulatory Visit: Payer: Self-pay | Admitting: *Deleted

## 2013-12-11 MED ORDER — HYDROCHLOROTHIAZIDE 12.5 MG PO TABS: ORAL_TABLET | ORAL | Status: DC

## 2014-01-05 DIAGNOSIS — T148 Other injury of unspecified body region: Secondary | ICD-10-CM | POA: Diagnosis not present

## 2014-01-05 DIAGNOSIS — W57XXXA Bitten or stung by nonvenomous insect and other nonvenomous arthropods, initial encounter: Secondary | ICD-10-CM | POA: Diagnosis not present

## 2014-01-05 DIAGNOSIS — F068 Other specified mental disorders due to known physiological condition: Secondary | ICD-10-CM | POA: Diagnosis not present

## 2014-01-24 ENCOUNTER — Other Ambulatory Visit: Payer: Self-pay | Admitting: *Deleted

## 2014-01-24 MED ORDER — HYDROCHLOROTHIAZIDE 12.5 MG PO TABS: ORAL_TABLET | ORAL | Status: DC

## 2014-01-28 ENCOUNTER — Emergency Department: Payer: Self-pay | Admitting: Emergency Medicine

## 2014-01-28 DIAGNOSIS — Z79899 Other long term (current) drug therapy: Secondary | ICD-10-CM | POA: Diagnosis not present

## 2014-01-28 DIAGNOSIS — N269 Renal sclerosis, unspecified: Secondary | ICD-10-CM | POA: Diagnosis not present

## 2014-01-28 DIAGNOSIS — R109 Unspecified abdominal pain: Secondary | ICD-10-CM | POA: Diagnosis not present

## 2014-01-28 DIAGNOSIS — F0390 Unspecified dementia without behavioral disturbance: Secondary | ICD-10-CM | POA: Diagnosis not present

## 2014-01-28 LAB — COMPREHENSIVE METABOLIC PANEL
ALBUMIN: 3.3 g/dL — AB (ref 3.4–5.0)
ANION GAP: 7 (ref 7–16)
Alkaline Phosphatase: 86 U/L
BUN: 29 mg/dL — AB (ref 7–18)
Bilirubin,Total: 0.2 mg/dL (ref 0.2–1.0)
CHLORIDE: 105 mmol/L (ref 98–107)
CO2: 30 mmol/L (ref 21–32)
CREATININE: 1.07 mg/dL (ref 0.60–1.30)
Calcium, Total: 8.8 mg/dL (ref 8.5–10.1)
EGFR (African American): 59 — ABNORMAL LOW
EGFR (Non-African Amer.): 51 — ABNORMAL LOW
Glucose: 102 mg/dL — ABNORMAL HIGH (ref 65–99)
OSMOLALITY: 289 (ref 275–301)
POTASSIUM: 3.2 mmol/L — AB (ref 3.5–5.1)
SGOT(AST): 27 U/L (ref 15–37)
SGPT (ALT): 18 U/L
Sodium: 142 mmol/L (ref 136–145)
Total Protein: 7.2 g/dL (ref 6.4–8.2)

## 2014-01-28 LAB — CBC WITH DIFFERENTIAL/PLATELET
BASOS ABS: 0.1 10*3/uL (ref 0.0–0.1)
Basophil %: 0.9 %
Eosinophil #: 0.4 10*3/uL (ref 0.0–0.7)
Eosinophil %: 4 %
HCT: 39.1 % (ref 35.0–47.0)
HGB: 12.8 g/dL (ref 12.0–16.0)
Lymphocyte #: 5.2 10*3/uL — ABNORMAL HIGH (ref 1.0–3.6)
Lymphocyte %: 47.4 %
MCH: 32.9 pg (ref 26.0–34.0)
MCHC: 32.6 g/dL (ref 32.0–36.0)
MCV: 101 fL — AB (ref 80–100)
MONO ABS: 1.2 x10 3/mm — AB (ref 0.2–0.9)
Monocyte %: 11.3 %
NEUTROS PCT: 36.4 %
Neutrophil #: 4 10*3/uL (ref 1.4–6.5)
PLATELETS: 365 10*3/uL (ref 150–440)
RBC: 3.88 10*6/uL (ref 3.80–5.20)
RDW: 13.6 % (ref 11.5–14.5)
WBC: 10.9 10*3/uL (ref 3.6–11.0)

## 2014-01-28 LAB — URINALYSIS, COMPLETE
BACTERIA: NONE SEEN
Bilirubin,UR: NEGATIVE
Glucose,UR: NEGATIVE mg/dL (ref 0–75)
Ketone: NEGATIVE
Nitrite: NEGATIVE
Ph: 6 (ref 4.5–8.0)
Protein: NEGATIVE
Specific Gravity: 1.027 (ref 1.003–1.030)
Squamous Epithelial: 2
WBC UR: 2 /HPF (ref 0–5)

## 2014-01-28 LAB — LIPASE, BLOOD: Lipase: 197 U/L (ref 73–393)

## 2014-01-29 LAB — URINE CULTURE

## 2014-03-23 ENCOUNTER — Other Ambulatory Visit: Payer: Self-pay | Admitting: *Deleted

## 2014-03-23 MED ORDER — HYDROCHLOROTHIAZIDE 12.5 MG PO TABS: ORAL_TABLET | ORAL | Status: DC

## 2014-04-20 ENCOUNTER — Other Ambulatory Visit: Payer: Self-pay | Admitting: *Deleted

## 2014-07-03 IMAGING — CT CT HEAD WITHOUT CONTRAST
1 series · 16 of 30 positions shown, 20 images · non-contrast
Comparison: none

REASON FOR EXAM: Auditory hallucinations Dementia
COMMENTS:

PROCEDURE:     CT  - CT HEAD WITHOUT CONTRAST  - February 02, 2013  [DATE]
RESULT:     History: Dementia.
Comparison Study: CT of 06/06/2012.

[Series 2: soft tissue · axial · 0.43mm/px · z∈[-140,-4]mm · 16 of 31 slices shown, 20 images]
[im 2/31  brain]
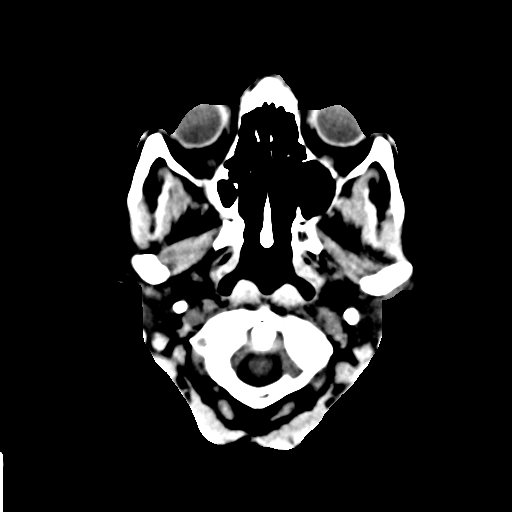
[im 2/31  bone]
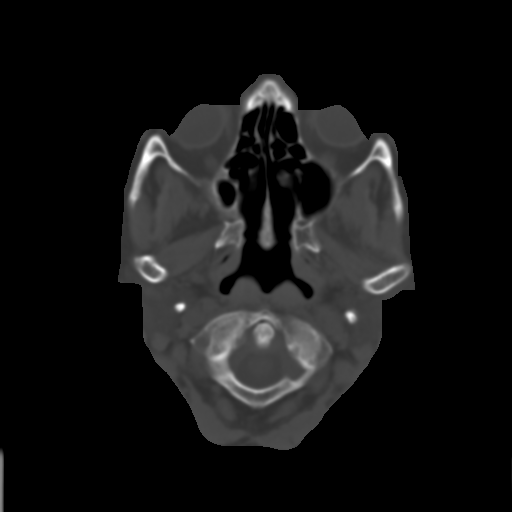
[im 4/31  brain]
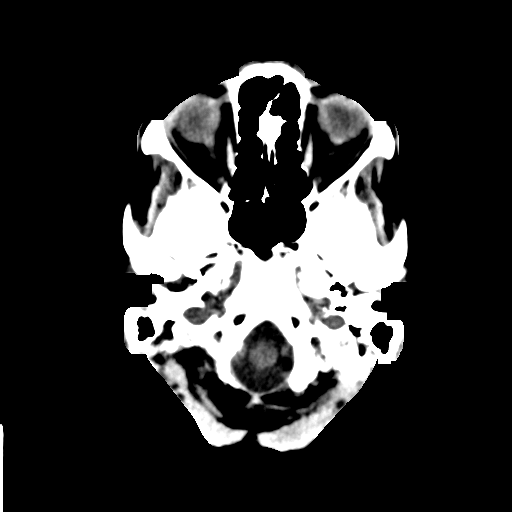
[im 6/31  brain]
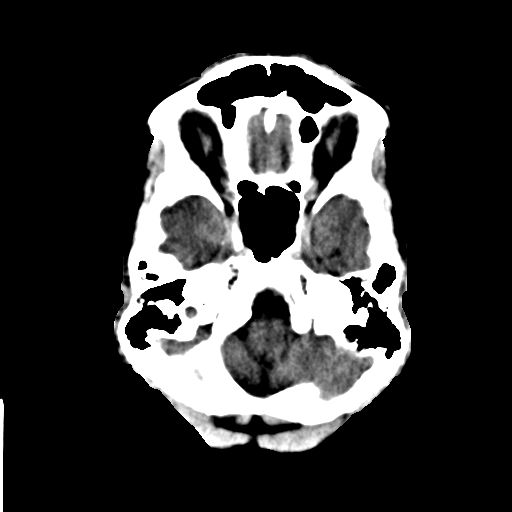
[im 8/31  brain]
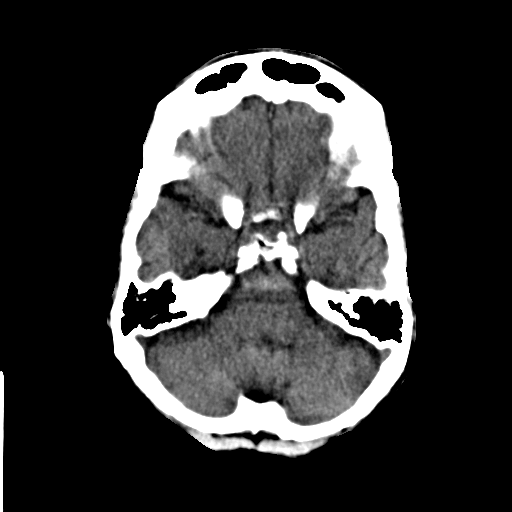
[im 9/31  brain]
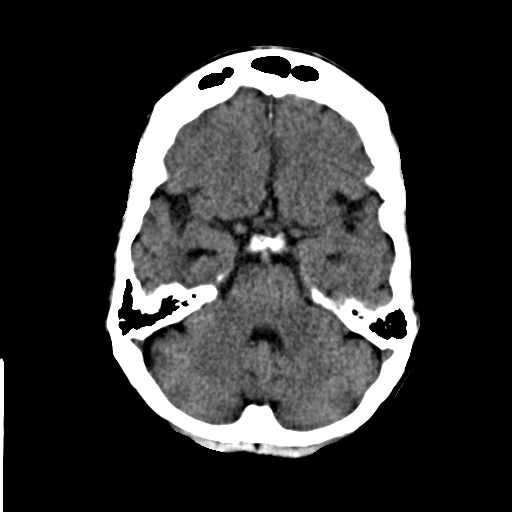
[im 9/31  bone]
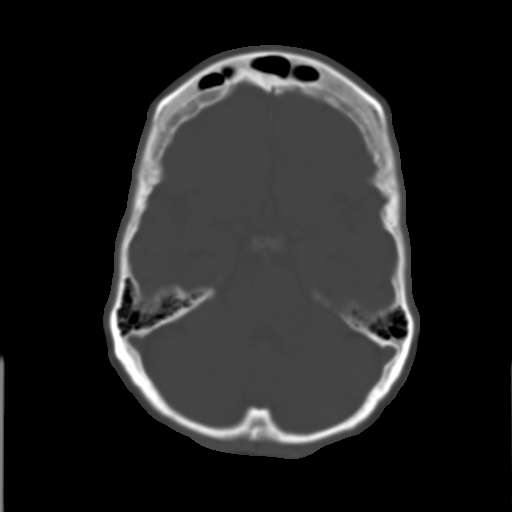
[im 11/31  brain]
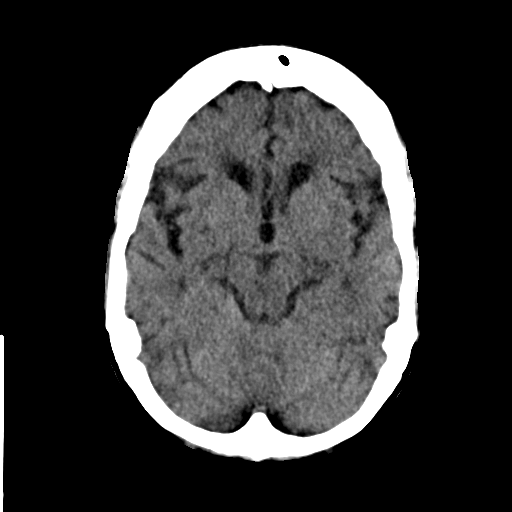
[im 13/31  brain]
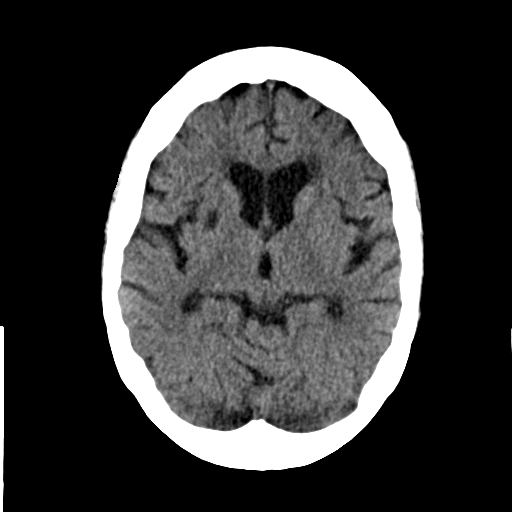
[im 15/31  brain]
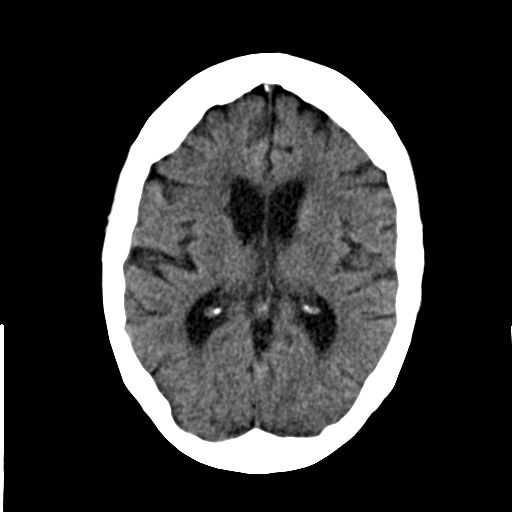
[im 16/31  brain]
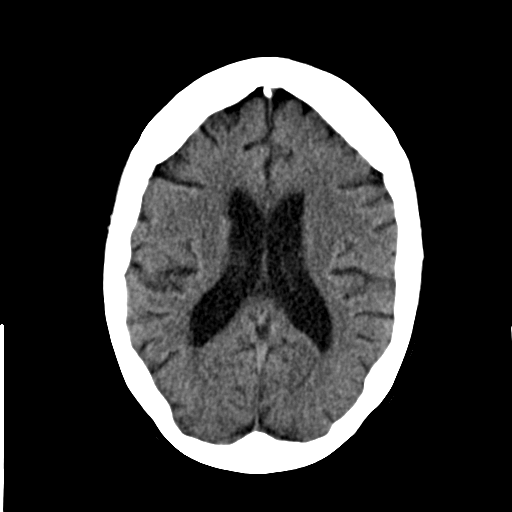
[im 16/31  bone]
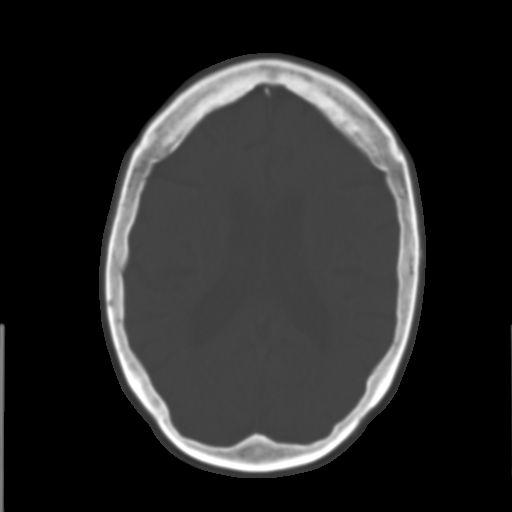
[im 18/31  brain]
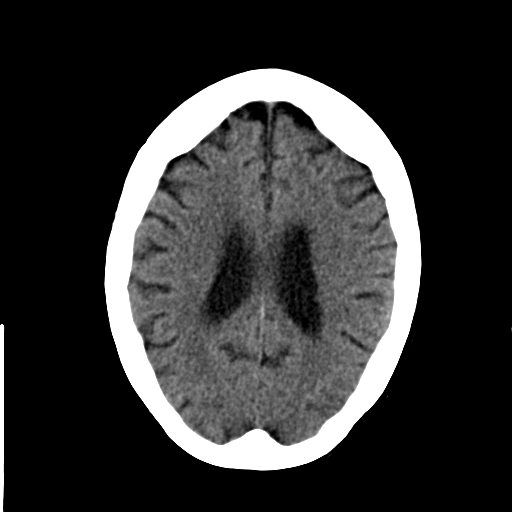
[im 20/31  brain]
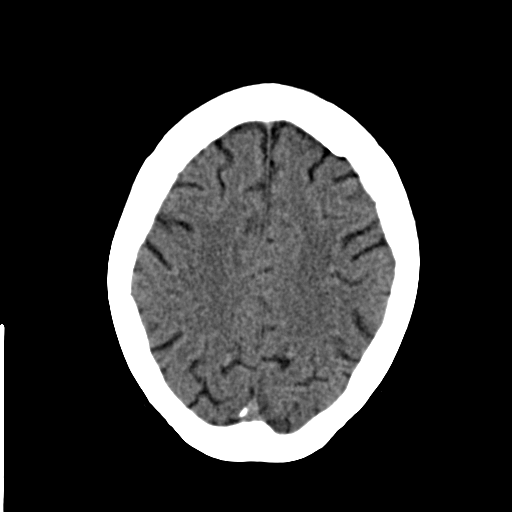
[im 22/31  brain]
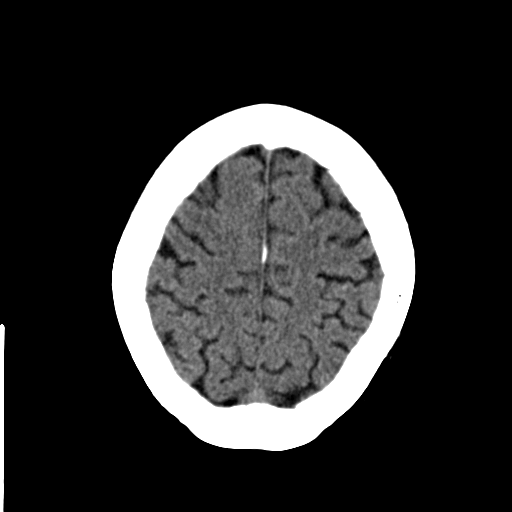
[im 23/31  brain]
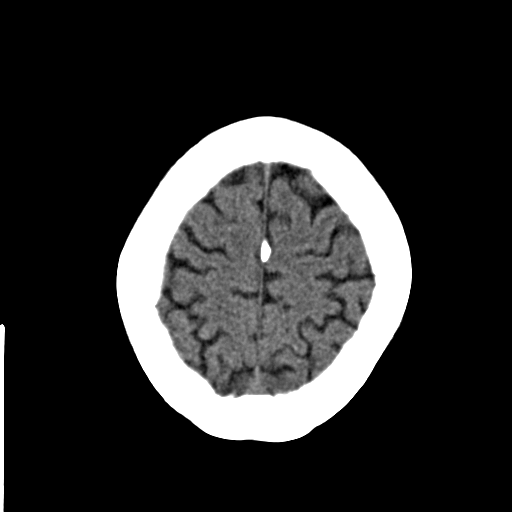
[im 23/31  bone]
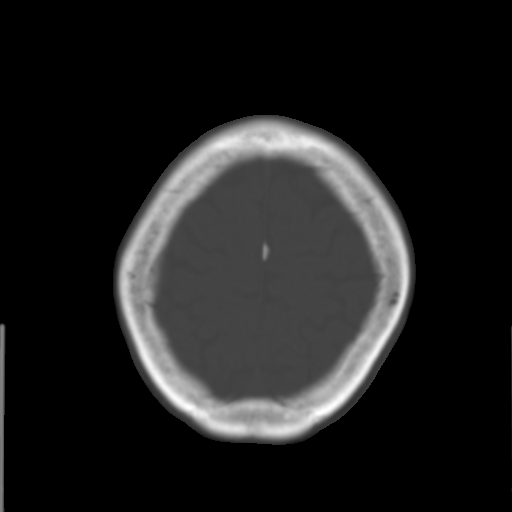
[im 25/31  brain]
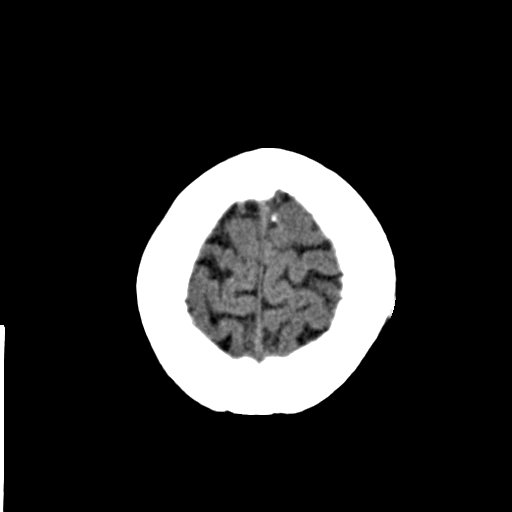
[im 27/31  brain]
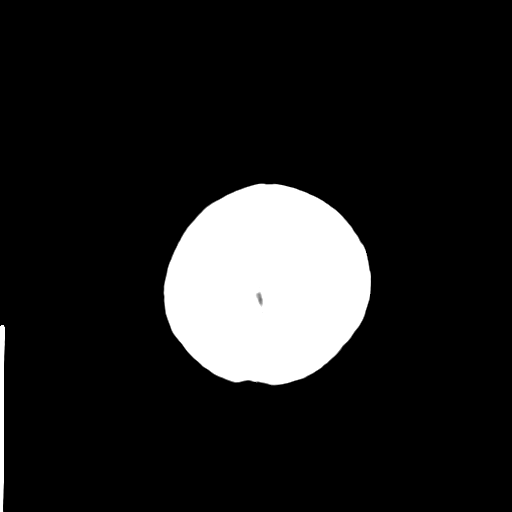
[im 29/31  brain]
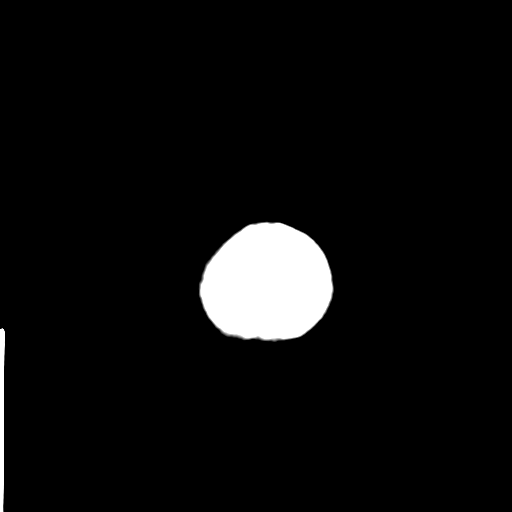

[16 of 30 positions shown; findings below may reference images not displayed]

FINDINGS: Standard nonenhanced CT obtained. No mass or pathologic
calcification identified. Lucency noted in the right lenticular nucleus
consistent with lacunar infarct. No acute bony abnormality.
IMPRESSION: Lucency right lenticular nucleus consistent lacunar
infarct. No other focal otherwise identified.

## 2014-07-05 ENCOUNTER — Emergency Department: Payer: Self-pay | Admitting: Internal Medicine

## 2014-07-05 DIAGNOSIS — R55 Syncope and collapse: Secondary | ICD-10-CM | POA: Diagnosis not present

## 2014-07-05 DIAGNOSIS — Z79899 Other long term (current) drug therapy: Secondary | ICD-10-CM | POA: Diagnosis not present

## 2014-07-05 DIAGNOSIS — F419 Anxiety disorder, unspecified: Secondary | ICD-10-CM | POA: Diagnosis not present

## 2014-07-05 DIAGNOSIS — R4182 Altered mental status, unspecified: Secondary | ICD-10-CM | POA: Diagnosis not present

## 2014-07-05 DIAGNOSIS — G40909 Epilepsy, unspecified, not intractable, without status epilepticus: Secondary | ICD-10-CM | POA: Diagnosis not present

## 2014-07-05 DIAGNOSIS — Z7982 Long term (current) use of aspirin: Secondary | ICD-10-CM | POA: Diagnosis not present

## 2014-07-05 DIAGNOSIS — J209 Acute bronchitis, unspecified: Secondary | ICD-10-CM | POA: Diagnosis not present

## 2014-07-05 DIAGNOSIS — I10 Essential (primary) hypertension: Secondary | ICD-10-CM | POA: Diagnosis not present

## 2014-07-05 DIAGNOSIS — R569 Unspecified convulsions: Secondary | ICD-10-CM | POA: Diagnosis not present

## 2014-07-05 DIAGNOSIS — G40901 Epilepsy, unspecified, not intractable, with status epilepticus: Secondary | ICD-10-CM | POA: Diagnosis not present

## 2014-07-05 LAB — URINALYSIS, COMPLETE
BACTERIA: NONE SEEN
Bilirubin,UR: NEGATIVE
GLUCOSE, UR: NEGATIVE mg/dL (ref 0–75)
KETONE: NEGATIVE
NITRITE: NEGATIVE
Ph: 6 (ref 4.5–8.0)
Protein: NEGATIVE
RBC,UR: 3 /HPF (ref 0–5)
Specific Gravity: 1.018 (ref 1.003–1.030)
Squamous Epithelial: 14
WBC UR: 22 /HPF (ref 0–5)

## 2014-07-05 LAB — COMPREHENSIVE METABOLIC PANEL
ALBUMIN: 3.2 g/dL — AB (ref 3.4–5.0)
ALT: 15 U/L
AST: 19 U/L (ref 15–37)
Alkaline Phosphatase: 94 U/L
Anion Gap: 10 (ref 7–16)
BUN: 17 mg/dL (ref 7–18)
Bilirubin,Total: 0.3 mg/dL (ref 0.2–1.0)
Calcium, Total: 8.4 mg/dL — ABNORMAL LOW (ref 8.5–10.1)
Chloride: 108 mmol/L — ABNORMAL HIGH (ref 98–107)
Co2: 25 mmol/L (ref 21–32)
Creatinine: 1.13 mg/dL (ref 0.60–1.30)
GFR CALC NON AF AMER: 50 — AB
GLUCOSE: 121 mg/dL — AB (ref 65–99)
Osmolality: 288 (ref 275–301)
POTASSIUM: 3.3 mmol/L — AB (ref 3.5–5.1)
Sodium: 143 mmol/L (ref 136–145)
TOTAL PROTEIN: 6.8 g/dL (ref 6.4–8.2)

## 2014-07-05 LAB — CBC
HCT: 41 % (ref 35.0–47.0)
HGB: 13.4 g/dL (ref 12.0–16.0)
MCH: 33.1 pg (ref 26.0–34.0)
MCHC: 32.7 g/dL (ref 32.0–36.0)
MCV: 101 fL — ABNORMAL HIGH (ref 80–100)
PLATELETS: 319 10*3/uL (ref 150–440)
RBC: 4.05 10*6/uL (ref 3.80–5.20)
RDW: 13.5 % (ref 11.5–14.5)
WBC: 15.3 10*3/uL — ABNORMAL HIGH (ref 3.6–11.0)

## 2014-07-05 LAB — VALPROIC ACID LEVEL: Valproic Acid: 62 ug/mL

## 2014-07-05 LAB — TROPONIN I: Troponin-I: <0.02 ng/mL

## 2014-07-05 LAB — CK TOTAL AND CKMB (NOT AT ARMC)
CK, Total: 83 U/L (ref 26–192)
CK-MB: 1.2 ng/mL (ref 0.5–3.6)

## 2014-09-28 ENCOUNTER — Emergency Department (HOSPITAL_COMMUNITY)
Admission: EM | Admit: 2014-09-28 | Discharge: 2014-09-29 | Disposition: A | Discharge status: Home / Self Care | Payer: Medicare Other | Attending: Emergency Medicine | Admitting: Emergency Medicine

## 2014-09-28 ENCOUNTER — Emergency Department (HOSPITAL_COMMUNITY): Payer: Medicare Other

## 2014-09-28 ENCOUNTER — Encounter (HOSPITAL_COMMUNITY): Payer: Self-pay | Admitting: *Deleted

## 2014-09-28 DIAGNOSIS — G40909 Epilepsy, unspecified, not intractable, without status epilepticus: Secondary | ICD-10-CM | POA: Insufficient documentation

## 2014-09-28 DIAGNOSIS — F0390 Unspecified dementia without behavioral disturbance: Secondary | ICD-10-CM

## 2014-09-28 DIAGNOSIS — R4182 Altered mental status, unspecified: Secondary | ICD-10-CM | POA: Diagnosis present

## 2014-09-28 DIAGNOSIS — R410 Disorientation, unspecified: Secondary | ICD-10-CM | POA: Diagnosis not present

## 2014-09-28 DIAGNOSIS — E559 Vitamin D deficiency, unspecified: Secondary | ICD-10-CM | POA: Insufficient documentation

## 2014-09-28 DIAGNOSIS — I6789 Other cerebrovascular disease: Secondary | ICD-10-CM | POA: Diagnosis not present

## 2014-09-28 DIAGNOSIS — Z79899 Other long term (current) drug therapy: Secondary | ICD-10-CM | POA: Insufficient documentation

## 2014-09-28 DIAGNOSIS — F4489 Other dissociative and conversion disorders: Secondary | ICD-10-CM | POA: Diagnosis not present

## 2014-09-28 DIAGNOSIS — I10 Essential (primary) hypertension: Secondary | ICD-10-CM | POA: Insufficient documentation

## 2014-09-28 DIAGNOSIS — Z7982 Long term (current) use of aspirin: Secondary | ICD-10-CM | POA: Insufficient documentation

## 2014-09-28 DIAGNOSIS — Z8673 Personal history of transient ischemic attack (TIA), and cerebral infarction without residual deficits: Secondary | ICD-10-CM | POA: Insufficient documentation

## 2014-09-28 DIAGNOSIS — Z87891 Personal history of nicotine dependence: Secondary | ICD-10-CM | POA: Insufficient documentation

## 2014-09-28 DIAGNOSIS — Z8572 Personal history of non-Hodgkin lymphomas: Secondary | ICD-10-CM | POA: Insufficient documentation

## 2014-09-28 DIAGNOSIS — Z8619 Personal history of other infectious and parasitic diseases: Secondary | ICD-10-CM | POA: Diagnosis not present

## 2014-09-28 LAB — CBC WITH DIFFERENTIAL/PLATELET
Band Neutrophils: 0 % (ref 0–10)
Basophils Absolute: 0 10*3/uL (ref 0.0–0.1)
Basophils Relative: 0 % (ref 0–1)
Blasts: 0 %
Eosinophils Absolute: 0.6 10*3/uL (ref 0.0–0.7)
Eosinophils Relative: 4 % (ref 0–5)
HCT: 41.7 % (ref 36.0–46.0)
Hemoglobin: 14 g/dL (ref 12.0–15.0)
Lymphocytes Relative: 57 % — ABNORMAL HIGH (ref 12–46)
Lymphs Abs: 8.5 10*3/uL — ABNORMAL HIGH (ref 0.7–4.0)
MCH: 33.3 pg (ref 26.0–34.0)
MCHC: 33.6 g/dL (ref 30.0–36.0)
MCV: 99 fL (ref 78.0–100.0)
Metamyelocytes Relative: 0 %
Monocytes Absolute: 1.4 10*3/uL — ABNORMAL HIGH (ref 0.1–1.0)
Monocytes Relative: 9 % (ref 3–12)
Myelocytes: 0 %
Neutro Abs: 4.5 10*3/uL (ref 1.7–7.7)
Neutrophils Relative %: 30 % — ABNORMAL LOW (ref 43–77)
Platelets: 376 10*3/uL (ref 150–400)
Promyelocytes Absolute: 0 %
RBC: 4.21 MIL/uL (ref 3.87–5.11)
RDW: 14.3 % (ref 11.5–15.5)
WBC: 15 10*3/uL — ABNORMAL HIGH (ref 4.0–10.5)
nRBC: 0 /100 WBC

## 2014-09-28 LAB — URINALYSIS, ROUTINE W REFLEX MICROSCOPIC
Bilirubin Urine: NEGATIVE
Glucose, UA: NEGATIVE mg/dL
Ketones, ur: NEGATIVE mg/dL
Leukocytes, UA: NEGATIVE
Nitrite: NEGATIVE
Protein, ur: NEGATIVE mg/dL
Specific Gravity, Urine: >1.03 — ABNORMAL HIGH (ref 1.005–1.030)
Urobilinogen, UA: 0.2 mg/dL (ref 0.0–1.0)
pH: 5.5 (ref 5.0–8.0)

## 2014-09-28 LAB — RAPID URINE DRUG SCREEN, HOSP PERFORMED
Amphetamines: NOT DETECTED
Barbiturates: NOT DETECTED
Benzodiazepines: NOT DETECTED
Cocaine: NOT DETECTED
Opiates: NOT DETECTED
Tetrahydrocannabinol: NOT DETECTED

## 2014-09-28 LAB — URINE MICROSCOPIC-ADD ON

## 2014-09-28 LAB — BASIC METABOLIC PANEL
Anion gap: 9 (ref 5–15)
BUN: 30 mg/dL — ABNORMAL HIGH (ref 6–23)
CO2: 28 mmol/L (ref 19–32)
Calcium: 9.4 mg/dL (ref 8.4–10.5)
Chloride: 105 mmol/L (ref 96–112)
Creatinine, Ser: 1.06 mg/dL (ref 0.50–1.10)
GFR calc Af Amer: 58 mL/min — ABNORMAL LOW (ref >90)
GFR calc non Af Amer: 50 mL/min — ABNORMAL LOW (ref >90)
Glucose, Bld: 101 mg/dL — ABNORMAL HIGH (ref 70–99)
Potassium: 3.7 mmol/L (ref 3.5–5.1)
Sodium: 142 mmol/L (ref 135–145)

## 2014-09-28 LAB — ETHANOL: Alcohol, Ethyl (B): <5 mg/dL (ref 0–9)

## 2014-09-28 LAB — VALPROIC ACID LEVEL: Valproic Acid Lvl: 47.2 ug/mL — ABNORMAL LOW (ref 50.0–100.0)

## 2014-09-28 MED ORDER — VITAMIN B-12 1000 MCG PO TABS: 1000.0000 ug | ORAL_TABLET | Freq: Every day | ORAL | Status: DC

## 2014-09-28 MED ORDER — OMEGA-3 FATTY ACIDS 1000 MG PO CAPS
1.0000 g | ORAL_CAPSULE | Freq: Every day | ORAL | Status: DC
Start: 2014-09-29 — End: 2014-09-29

## 2014-09-28 MED ORDER — LEVETIRACETAM 500 MG PO TABS
500.0000 mg | ORAL_TABLET | Freq: Two times a day (BID) | ORAL | Status: DC
  Administered 2014-09-29: 500 mg via ORAL
  Filled 2014-09-28: qty 1

## 2014-09-28 MED ORDER — ASPIRIN 81 MG PO TABS: 81.0000 mg | ORAL_TABLET | Freq: Every day | ORAL | Status: DC

## 2014-09-28 MED ORDER — HYDROCHLOROTHIAZIDE 12.5 MG PO TABS: 12.5000 mg | ORAL_TABLET | Freq: Every day | ORAL | Status: DC

## 2014-09-28 MED ORDER — DIVALPROEX SODIUM 250 MG PO DR TAB
250.0000 mg | DELAYED_RELEASE_TABLET | Freq: Once | ORAL | Status: AC
  Administered 2014-09-28: 250 mg via ORAL
  Filled 2014-09-28: qty 1

## 2014-09-28 MED ORDER — SODIUM CHLORIDE 0.9 % IV BOLUS (SEPSIS)
1000.0000 mL | Freq: Once | INTRAVENOUS | Status: AC
  Administered 2014-09-28: 1000 mL via INTRAVENOUS

## 2014-09-28 MED ORDER — DIVALPROEX SODIUM 250 MG PO DR TAB: 250.0000 mg | DELAYED_RELEASE_TABLET | Freq: Three times a day (TID) | ORAL | Status: DC

## 2014-09-28 MED ORDER — DONEPEZIL HCL 5 MG PO TABS
5.0000 mg | ORAL_TABLET | Freq: Every day | ORAL | Status: DC
Start: 2014-09-29 — End: 2014-09-29

## 2014-09-28 NOTE — ED Notes (Signed)
Phone number of friend to contact Mariann Laster [phone]

## 2014-09-28 NOTE — ED Notes (Signed)
Patient was driving from [place] to [employer] when she got confused about where she was. Stopped at store -  Employee there contacted police d/t her confusion and their concern about her.

## 2014-09-28 NOTE — ED Provider Notes (Signed)
CSN: [id number]     Arrival date & time 09/28/14  2135 History  This chart was scribed for Virgel Manifold, MD by Delphia Grates, ED Scribe. This patient was seen in room APA01/APA01 and the patient's care was started at 9:51 PM.     Chief Complaint  Patient presents with  . Altered Mental Status    The history is provided by the patient. No language interpreter was used.     HPI Comments:  34yF brought in by EMS for evaluation of odd behavior. Pt lives in [place]. Driving to [place] to go dancing when stopped in a store. Store clerk concerned about odd interaction with her and called police. Apparently she was just saying bizarre things. Police subsequently called EMS. Pt says she was driving to go dancing but not sure if she was by herself or not. Reportedly she was alone. Pt unable to provide me any details of her time in the store and seems confused when questioned about this. Able to tel me is is Friday. Unable to provide year or month. "It's one of the warm months." Loves by herself. Has a son named Elta Guadeloupe who lives in [place] and daughter Lattie Haw that lives in [place]. Unable to provide either of their phone numbers. She denies alcohol or drug use. Denies trauma.   Police at bedside report that previous calls to residence concerning pt having hallucinations. Review of records shows she has hx of seizure. Followed by Dr Manuella Ghazi at [facility]. Previous notes mention auditory hallucinations. Not clear if this episodic or more chronic. Office note from 09/21/13 mentions that going to start her on seroquel because of this though. Pt unable to provide any specifics of her medical history of medications. Aricept noted on med list.    Past Medical History  Diagnosis Date  . Seizures since teen    following head trauma as 74 yo (Dr. Manuella Ghazi, [facility])  . 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 Vitamin D deficiency   . History of stroke     on imaging - lacunar R and L pons and R occipital  .  Idiopathic peripheral neuropathy   . History of chicken pox   . Hypertension   . Stroke    Past Surgical History  Procedure Laterality Date  . 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 Partial gastrectomy  2004    lymphoma  . Eye surgery Bilateral 2014    unclear type  . Colonoscopy  02/2013    mild diverticulosis, WNL, rpt 10 Fuller Plan)   Family History  Problem Relation Age of Onset  . Diabetes    . Alcohol abuse Father   . CAD Mother     MI  . Cancer - Other Mother     py unsure what kind of cancer mother had   History  Substance Use Topics  . Smoking status: Former Research scientist (life sciences)  . Smokeless tobacco: Never Used  . Alcohol Use: No   OB History    No data available     Review of Systems  All other systems reviewed and are negative.     Allergies  Review of patient's allergies indicates no known allergies.  Home Medications   Prior to Admission medications   Medication Sig Start Date End Date Taking? Authorizing Provider  aspirin 81 MG tablet Take 81 mg by mouth daily.    Historical Provider, MD  Calcium Carb-Cholecalciferol (CALCIUM-VITAMIN D) 600-400 MG-UNIT TABS Take 1 tablet by mouth 2 (  two) times daily.    Historical Provider, MD  cholecalciferol (VITAMIN D) 1000 UNITS tablet Take 1,000 Units by mouth daily.    Historical Provider, MD  cyclobenzaprine (FLEXERIL) 10 MG tablet Take 10 mg by mouth 3 (three) times daily as needed for muscle spasms.    Historical Provider, MD  divalproex (DEPAKOTE) 250 MG DR tablet Take 1 tablet (250 mg total) by mouth 3 (three) times daily. 06/13/12   Ria Bush, MD  donepezil (ARICEPT) 5 MG tablet Take 5 mg by mouth daily.    Historical Provider, MD  fish oil-omega-3 fatty acids 1000 MG capsule Take 1 g by mouth daily.    Historical Provider, MD  hydrochlorothiazide (HYDRODIURIL) 12.5 MG tablet Take one tablet daily **MUST HAVE APPOINTMENT FOR FURTHER REFILLS** 03/23/14   Ria Bush, MD  levETIRAcetam (KEPPRA) 500 MG tablet Take 500  mg by mouth every 12 (twelve) hours.    Historical Provider, MD  Na Sulfate-K Sulfate-Mg Sulf (SUPREP BOWEL PREP) SOLN Take 1 kit by mouth once. suprep as directed. No substitutions 01/24/13   Inda Castle, MD  vitamin B-12 (CYANOCOBALAMIN) 1000 MCG tablet Take 1,000 mcg by mouth daily.    Historical Provider, MD  zinc gluconate 50 MG tablet Take 50 mg by mouth daily.    Historical Provider, MD   Triage Vitals: BP 174/98 mmHg  Pulse 92  Temp(Src) 98.1 F (36.7 C) (Oral)  Resp 16  SpO2 99%  Physical Exam  Constitutional: She appears well-developed and well-nourished. No distress.  Sitting in bed. Appears well. No external signs of trauma.   HENT:  Head: Normocephalic and atraumatic.  Eyes: Conjunctivae are normal. Right eye exhibits no discharge. Left eye exhibits no discharge.  Neck: Neck supple.  Cardiovascular: Normal rate and regular rhythm.  Exam reveals no gallop and no friction rub.   No murmur heard. Pulmonary/Chest: Effort normal and breath sounds normal. No respiratory distress.  Abdominal: Soft. She exhibits no distension. There is no tenderness.  Musculoskeletal: She exhibits no edema or tenderness.  Neurological: She is alert.  Disoriented to month and year. Follows commands. Cn 2-12 intact. No focal motor deficit. Recalls distant past events and with some detail such as able to gives dates of birth of children. She has difficulty with recalling recent events though.  Skin: Skin is warm and dry.  Psychiatric:  Calm. Cooperative. Answers most questions appropriately but seems to loose train of thought easily.  Nursing note and vitals reviewed.   ED Course  Procedures (including critical care time)  DIAGNOSTIC STUDIES: Oxygen Saturation is 99% on room air, normal by my interpretation.    COORDINATION OF CARE:  Labs Review Labs Reviewed  CBC WITH DIFFERENTIAL/PLATELET - Abnormal; Notable for the following:    WBC 15.0 (*)    Neutrophils Relative % 30 (*)     Lymphocytes Relative 57 (*)    Lymphs Abs 8.5 (*)    Monocytes Absolute 1.4 (*)    All other components within normal limits  BASIC METABOLIC PANEL - Abnormal; Notable for the following:    Glucose, Bld 101 (*)    BUN 30 (*)    GFR calc non Af Amer 50 (*)    GFR calc Af Amer 58 (*)    All other components within normal limits  VALPROIC ACID LEVEL - Abnormal; Notable for the following:    Valproic Acid Lvl 47.2 (*)    All other components within normal limits  URINALYSIS, ROUTINE W REFLEX MICROSCOPIC - Abnormal;  Notable for the following:    Specific Gravity, Urine >1.030 (*)    Hgb urine dipstick SMALL (*)    All other components within normal limits  URINE MICROSCOPIC-ADD ON - Abnormal; Notable for the following:    Squamous Epithelial / LPF FEW (*)    All other components within normal limits  URINE RAPID DRUG SCREEN (HOSP PERFORMED)  ETHANOL    Imaging Review Ct Head Wo Contrast  09/28/2014   CLINICAL DATA:  Initial evaluation for acute altered mental status, confusion.  EXAM: CT HEAD WITHOUT CONTRAST  TECHNIQUE: Contiguous axial images were obtained from the base of the skull through the vertex without intravenous contrast.  COMPARISON:  None.  FINDINGS: Mild diffuse prominence of the CSF containing spaces is compatible with generalized cerebral atrophy. Confluent hypodensity within the periventricular white matter most consistent with chronic small vessel ischemic changes. Remote lacunar infarct present within the right basal ganglia.  No acute large vessel territory infarct. No intracranial hemorrhage. No mass lesion or midline shift. No hydrocephalus. There is no extra-axial fluid collection.  Scalp soft tissues within normal limits. No acute abnormality seen about the orbits.  Sequelae of chronic right maxillary sinus disease noted. Paranasal sinuses are currently clear. No mastoid effusion.  Calvarium intact.  IMPRESSION: 1. No acute intracranial process. 2. Remote lacunar infarct  within the right basal ganglia. 3. Atrophy with chronic microvascular ischemic disease.   Electronically Signed   By: Jeannine Boga M.D.   On: 09/28/2014 22:38     EKG Interpretation None      MDM   Final diagnoses:  Confusion  Dementia, without behavioral disturbance    73yF brought in for evaluation after being found in community and concern for her well being because of bizarre behavior. She has a documented past history of seizure and auditory hallucinations. I'm not sure what he exact baseline is. Unfortunately she lives by herself and unable to provide contact details for collateral information. She is currently calm and cooperative. W/u has been fairly unremarkable. Would appreciate TTS evaluation.   I personally preformed the services scribed in my presence. The recorded information has been reviewed is accurate. Virgel Manifold, MD.    Virgel Manifold, MD 10/01/14 [phone]

## 2014-09-29 ENCOUNTER — Telehealth: Payer: Self-pay | Admitting: Family Medicine

## 2014-09-29 NOTE — ED Notes (Signed)
Up to bathroom with assistance, states she feels fine now and would like to go home. Explained to pt that breakfast would be up soon and we still needed to find her a ride home. Pt ok with that plan

## 2014-09-29 NOTE — Telephone Encounter (Signed)
Pt seen at ER over weekend with confusion Not seen here since 2014. Recommend 30 min ER f/u office appt in next few weeks.

## 2014-09-29 NOTE — Discharge Instructions (Signed)
You need to take her medication every day. You should not be driving anymore due to your confusion. Follow-up with your primary care doctor next week.

## 2014-09-29 NOTE — ED Provider Notes (Signed)
Patient's friend is [place]. He states he normally checks on her twice a day. He states she did not take her medication today. He states she gets confused when she doesn't take her medication. He is able to take her home at this time. Patient is willing to be discharged. Does recognize who he hasn't tells me his name.  Review of her chart in 2014 her primary care doctor at that time Dr. Danise Mina question whether patient had early dementia at that time. She was disoriented to year and time.  Plan discharge  Rolland Porter, MD, Barbette Or, MD 09/29/14 [phone]

## 2014-09-29 NOTE — BH Assessment (Addendum)
Tele Assessment Note   Hannah Howell is a 74 y.o. female who was brought in voluntarily by EMS for psych evaluation.  Pt was driving from [place] to [place] and stopped at a gas station because she was confused. The employee in the store was concerned about pt because she was driving and called the police and the called DSS and they called EMS to have her transported to the [facility] [employer].  Per nurse(Laurie T.), pt is confused and when asked specific questions about her hx, she unable to answer questions.  Pt is not SI/HI/AVH.  She lives alone but has a female companion, she also has a son and a daughter.  Pt was able to tell this Probation officer that her son resides in [place] and her daughter lives in [place], but some difficulty remembering where her son lives.  This Probation officer check ed her hx and also nurse advised that was seeing a Dr. Danise Mina( PCP) in 2014 and he dx her with early signs of dementia.  Pt has not additional hx since then and told this writer she was not currently seeing a physician.  Pt is prescribed aricept.  Pt was unable to tell this writer the names of any of her medications.  When asked if she has problems remembering sometimes, she replied--"I don't seem to, if you can't remember things, what do you have to remember".  Per H&P, pt was driving to go dancing but not sure if she was alone or not(pt.'s companion is out to town for the weekend, per nurse).  Pt cannot this writer the date, but verified to EDP that it is Friday, but couldn't provide the date.  She told the EDP that her son lives in [place], [place] provide phone #'s so they may be contacted.  The police reported that previous calls to the pt.'s home regarding pt having hallucinations(notes show aud halluc) and medical hx shows seizures, resulting from a head trauma at 74 yrs old.   This Probation officer discussed interview with Arlester Marker, NP and Joann(AC), it was determine by both parties agree that pt does not meet criteria for inpt  admission, however there is concern because of pt.'s confusion, living alone and companion not being avail because he is away.  Pt could cause harm to herself because she continues to drive and there is a concern about pt properly taking her medications as well.  Per Arlester Marker, NP does not feel comfortable d/c pt without contacting pt.'s emerg contact and informing him and making a plan for pt for a safe d/c.  There is no # listed for 1st emerg contact, but son's # is listed [phone]. Informed ed of disposition. This Probation officer spoke with pt.'s nurse(Jamie) and she provided # for ralph [phone]), this writer attempted to call, but was unable to reach him.  Arlester Marker, NP, also suggests to involve social work to help pt obtain needed services and also help with d/c planning.   Axis I: Unspecified neurocognitive disorder Axis II: Deferred Axis III:  Past Medical History  Diagnosis Date  . Seizures since teen    following head trauma as 74 yo (Dr. Manuella Ghazi, [facility])  . 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 Vitamin D deficiency   . History of stroke     on imaging - lacunar R and L pons and R occipital  . Idiopathic peripheral neuropathy   . History of chicken pox   . Hypertension   . Stroke  Axis IV: other psychosocial or environmental problems, problems related to social environment and problems with primary support group Axis V: 41-50 serious symptoms  Past Medical History:  Past Medical History  Diagnosis Date  . Seizures since teen    following head trauma as 74 yo (Dr. Manuella Ghazi, [facility])  . 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 Vitamin D deficiency   . History of stroke     on imaging - lacunar R and L pons and R occipital  . Idiopathic peripheral neuropathy   . History of chicken pox   . Hypertension   . Stroke     Past Surgical History  Procedure Laterality Date  . 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 Partial gastrectomy  2004    lymphoma  . Eye  surgery Bilateral 2014    unclear type  . Colonoscopy  02/2013    mild diverticulosis, WNL, rpt 10 Fuller Plan)    Family History:  Family History  Problem Relation Age of Onset  . Diabetes    . Alcohol abuse Father   . CAD Mother     MI  . Cancer - Other Mother     py unsure what kind of cancer mother had    Social History:  reports that she has quit smoking. She has never used smokeless tobacco. She reports that she does not drink alcohol or use illicit drugs.  Additional Social History:  Alcohol / Drug Use Pain Medications: See MAR  Prescriptions: See MAR  Over the Counter: See MAR  History of alcohol / drug use?: No history of alcohol / drug abuse Longest period of sobriety (when/how long): None   CIWA: CIWA-Ar BP: 174/98 mmHg Pulse Rate: 92 COWS:    PATIENT STRENGTHS: (choose at least two) Supportive family/friends  Allergies: No Known Allergies  Home Medications:  (Not in a hospital admission)  OB/GYN Status:  No LMP recorded. Patient is postmenopausal.  General Assessment Data Location of Assessment: [facility] ED Is this a Tele or Face-to-Face Assessment?: Tele Assessment Is this an Initial Assessment or a Re-assessment for this encounter?: Initial Assessment Living Arrangements: Alone Can pt return to current living arrangement?: Yes Admission Status: Voluntary Is patient capable of signing voluntary admission?: No Transfer from: Home Referral Source: Self/Family/Friend  Medical Screening Exam ([place]) Medical Exam completed: No Reason for MSE not completed: Other: (None )  [place] Living Arrangements: Alone Name of Psychiatrist: None  Name of Therapist: None   Education Status Is patient currently in school?: No Current Grade: None  Highest grade of school patient has completed: None  Name of school: None  Contact person: None   Risk to self with the past 6 months Suicidal Ideation: No Suicidal Intent: No Is patient at risk for  suicide?: No Suicidal Plan?: No Access to Means: No What has been your use of drugs/alcohol within the last 12 months?: None  Previous Attempts/Gestures: No How many times?: 0 Other Self Harm Risks: None  Triggers for Past Attempts: None known Intentional Self Injurious Behavior: None Family Suicide History: No Recent stressful life event(s): Recent negative physical changes Persecutory voices/beliefs?: No Depression: No Depression Symptoms:  (None REported ) Substance abuse history and/or treatment for substance abuse?: No Suicide prevention information given to non-admitted patients: Not applicable  Risk to Others within the past 6 months Homicidal Ideation: No Thoughts of Harm to Others: No Current Homicidal Intent: No Current Homicidal Plan: No Access to  Homicidal Means: No Identified Victim: None  History of harm to others?: No Assessment of Violence: None Noted Violent Behavior Description: None  Does patient have access to weapons?: No Criminal Charges Pending?: No Does patient have a court date: No  Psychosis Hallucinations: None noted Delusions: None noted  Mental Status Report Appearance/Hygiene: In hospital gown Eye Contact: Good Motor Activity: Unremarkable Speech: Logical/coherent Level of Consciousness: Alert, Quiet/awake Mood: Other (Comment) (Appropriate ) Affect: Other (Comment), Appropriate to circumstance Anxiety Level: None Thought Processes: Relevant, Coherent Judgement: Impaired Orientation: Person, Place, Situation Obsessive Compulsive Thoughts/Behaviors: None  Cognitive Functioning Concentration: Decreased Memory: Recent Impaired, Remote Impaired IQ: Average Insight: Poor Impulse Control: Good Appetite: Good Weight Loss: 0 Weight Gain: 0 Sleep: No Change Total Hours of Sleep: 5 Vegetative Symptoms: None  ADLScreening [facility] Assessment Services) Patient's cognitive ability adequate to safely complete daily activities?: Yes Patient  able to express need for assistance with ADLs?: Yes Independently performs ADLs?: Yes (appropriate for developmental age)  Prior Inpatient Therapy Prior Inpatient Therapy: No Prior Therapy Dates: None  Prior Therapy Facilty/Provider(s): None  Reason for Treatment: None   Prior Outpatient Therapy Prior Outpatient Therapy: No Prior Therapy Dates: None  Prior Therapy Facilty/Provider(s): None  Reason for Treatment: None   ADL Screening (condition at time of admission) Patient's cognitive ability adequate to safely complete daily activities?: Yes Is the patient deaf or have difficulty hearing?: No Does the patient have difficulty seeing, even when wearing glasses/contacts?: No Does the patient have difficulty concentrating, remembering, or making decisions?: Yes Patient able to express need for assistance with ADLs?: Yes Does the patient have difficulty dressing or bathing?: No Independently performs ADLs?: Yes (appropriate for developmental age) Does the patient have difficulty walking or climbing stairs?: No Weakness of Legs: None Weakness of Arms/Hands: None  Home Assistive Devices/Equipment Home Assistive Devices/Equipment: None  Therapy Consults (therapy consults require a physician order) PT Evaluation Needed: No OT Evalulation Needed: No SLP Evaluation Needed: No Abuse/Neglect Assessment (Assessment to be complete while patient is alone) Physical Abuse: Denies Verbal Abuse: Denies Sexual Abuse: Denies Exploitation of patient/patient's resources: Denies Self-Neglect: Denies Values / Beliefs Cultural Requests During Hospitalization: None Spiritual Requests During Hospitalization: None Consults Spiritual Care Consult Needed: No Social Work Consult Needed: No Regulatory affairs officer (For Healthcare) Does patient have an advance directive?: No Would patient like information on creating an advanced directive?: No - patient declined information    Additional Information 1:1  In Past 12 Months?: No CIRT Risk: No Elopement Risk: No Does patient have medical clearance?: Yes     Disposition:  Disposition Initial Assessment Completed for this Encounter: Yes Disposition of Patient: Other dispositions (Per Arlester Marker, NP does not meet citeria for inpt admit ) Other disposition(s): Other (Comment) (Per Arlice Colt does not meet criteria for inpt admit )  Girtha Rm 09/29/2014 12:02 AM

## 2014-09-29 NOTE — ED Notes (Signed)
Spoke with Coralyn Mark from [facility]. Coralyn Mark stated patient does not meet criteria for inpatient admission. Milinda Hirschfeld number to contact friend Mariann Laster. Coralyn Mark stated if friends is unable to care for patient, patient may need social work consult. Gerlene Burdock, RN and charge nurse notified.

## 2014-10-02 NOTE — Telephone Encounter (Signed)
Spoke with pt and appt scheduled

## 2014-10-05 ENCOUNTER — Ambulatory Visit: Payer: Medicare Other | Admitting: Family Medicine

## 2014-10-05 DIAGNOSIS — Z0289 Encounter for other administrative examinations: Secondary | ICD-10-CM

## 2014-10-23 NOTE — Consult Note (Signed)
PATIENT NAME:  Hannah Howell, Hannah Howell MR#:  [id number] DATE OF BIRTH:  04-May-1941  DATE OF CONSULTATION:  06/08/2012  REFERRING PHYSICIAN:  Wheatland Sink, MD CONSULTING PHYSICIAN:  Hemang K. Manuella Ghazi, MD  REASON FOR CONSULTATION: Seizure and dementia.  HISTORY OF PRESENT ILLNESS: [name] is a 74 year old Caucasian female with history of seizures since age 3 after a head trauma, mostly nocturnal, and has been seizure-free for many years. The patient was last seen by Dr. Theda Sers in 2011 when she was not having any seizures.   Recently, the patient went to see her primary care physician who noted that the patient had increased level of her phenobarbital and decreased phenobarbital, from one pill in the morning and two pills at night to one pill in the morning and one pill at night.   The patient's son who just moved from [place] noticed the patient having a seizure.   Seizure semiology was described as the patient starring, looking feared, and eyes wide open.   Then he heard a scream followed by stiffening of the upper and lower extremity with fairly fine jerking motion.   This episode lasted for around five minutes or so. The patient had some blood-tinged saliva coming out of the left side of the face.   The patient had two other seizures with the same semiology afterwards.   First seizure noted by the son was on 06/06/2012 around 2:00 a.m.   The patient also has some signs of cognitive impairment which is very difficult to find out exactly what was her functional baseline as she lives by herself and son came to check on her around a week ago and mentioned that she is in a "vegetable state".  She has decreased oral intake. She has decreased social contacts.   Apparently she was taking care of herself, was taking her medications herself, and was managing her finances. She was walking and cooking for herself.   It is very hard to know exactly what was her baseline as the patient is  postictal and her son does not have much idea of exactly what is her functional baseline.   PAST MEDICAL HISTORY:  1. Seizure disorder.  2. Gastrointestinal cancer around 10 years ago. 3. Dementia.   SOCIAL HISTORY: The patient used to live in [place], [place] and moved to [place] around 2010. She used to smoke but has not smoked in a couple of years. She does not drink alcohol, denied any drug abuse.   FAMILY HISTORY: The patient has a history of heart attack in her mother. Denied any history of epilepsy.   HOME MEDICATIONS: I reviewed her home medications. 1. Phenobarbital 32.4 mg three times daily. 2. Depakote 250 mg three times daily.   DRUG ALLERGIES: I reviewed her allergies and other medications.   REVIEW OF SYSTEMS: Negative except recent seizure and per History of Present Illness.   The patient is postictal so it is not a very reliable review of systems.   PHYSICAL EXAMINATION:  GENERAL: She is an elderly-looking Caucasian female lying in bed, not in acute distress, wearing glasses. She has an IV line placed in her right dorsal hand. Her son is at bedside.   Son is quite emotional describing her history.   LUNGS: Clear to auscultation.   HEART: S1 and S2 heart sounds.   NECK: Carotid examination did not reveal any bruit.   NEUROLOGIC: She was alert. She was not oriented. She could not tell me the day, date, month, or  year. The most recent president she can think of is Water engineer. She could not tell me her current address. She actually mixed up her old address and current address.   She was able to tell me her son's date of birth. Her immediate recall was three out of three but delayed recall was zero out of three.   She had some difficulty with two-step inverted commands such as she could not tell me exactly who was there when I asked her the question, tiger was killed by lion tell me who is dead, and later she stated it was the lion.   The patient's  attention and concentration seems to be impaired.  On her cranial nerves, her pupils are equal, round, and reactive. She has bilateral cataracts. Her face was symmetric. Tongue was midline. Facial sensations were intact. Her hearing seems to be intact. Her visual fields seem to be full.   Detailed visual activity exam was not done.   Funduscopic exam was difficult due to her cataract.   On her motor examination, she moved all extremities symmetrically. Her tone was not normal. Her sensations were intact to light touch. Her deep tendon reflexes were symmetric.   I did not check her gait.   REVIEW OF RADIOLOGIC DATA: On her CT scan of the head, she has lacunar infarct in her right anterior putamen region close to anterior limb of internal capsule.   She also has some other white matter microvascular ischemic changes.   ASSESSMENT AND PLAN:  1. Recurrent seizure in a patient with history of nocturnal seizure after head trauma since age of 28.   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   But, she has been on phenobarbital and Depakote for many years. Family feels uncomfortable switching her over to different medications as she has not had a seizure in more than 3 to 4 decades on this combination of medication.   For now we can continue phenobarbital 32.4 mg one in the morning and two pills at night and Depakote 250 mg three times daily.  But, she needs to have a follow up appointment in the future and more informed decision about newer antiepileptic medications, etc.   We should do the seizure work-up with epilepsy protocol and MRI of the brain, as well as EEG.   I offered them that this can be done as an outpatient, but they would like to get it done as soon as possible before the son leaves for [place].   I talked to the patient and son about seizure precautions and importance of calling 911 if the seizure  lasts for more than five minutes or has a different semiology.   I explained to her the ways to do first-aid for seizure, etc. The patient should not drive. In the state of [place], a person should not drive for at least six months after their seizure.   2. Right lacunar infarct which seems to be incidental and looks chronic: The patient should take an antiplatelet on a regular basis. She should have good control of her primary risk factors such as hypertension, etc.  3. Dementia: It is very hard to know exactly what is her functional baseline. Her current cognitive examination was very bad, but might be just postictal changes related.   Typically dementia is not a diagnosis that can be formed in a postictal state.   But based on the description of the son, I do suspect  that she does have some baseline cognitive impairment and he would like to get her started on some medication. We can give her donepezil 5 mg p.o. at bedtime.   The patient should have titer function test, vitamin B12, folate, and methylmalonic acid check ideally.   She is taking empiric vitamin B12 supplements.   The patient also has known vitamin D deficiency, per primary care physician.   I will follow this patient in the hospital with you.  Her primary care physician is Dr. Halina Maidens. ____________________________ Royetta Crochet. Manuella Ghazi, MD hks:slb D: 06/08/2012 12:09:13 ET T: 06/08/2012 12:35:06 ET JOB#: [id number]  cc: Hemang K. Manuella Ghazi, MD, <Dictator> Halina Maidens, MD Royetta Crochet [facility] MD ELECTRONICALLY SIGNED 06/21/2012 8:43

## 2014-10-23 NOTE — Discharge Summary (Signed)
PATIENT NAME:  Hannah Howell, Hannah Howell MR#:  [id number] DATE OF BIRTH:  07-09-1940  DATE OF ADMISSION:  06/06/2012 DATE OF DISCHARGE:  06/09/2012  ADMITTING PHYSICIAN: Scotland Sink, MD  DISCHARGING PHYSICIAN: Gladstone Lighter, MD  PRIMARY CARE PHYSICIAN: [facility]  CONSULTANTS: Jennings Books, MD - Neurology.  DISCHARGE DIAGNOSES:  1. Seizures.  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  3. Early dementia.  DISCHARGE HOME MEDICATIONS: 1. Phenobarbital 32.4 mg 1 tablet p.o. three times daily. 2. Depakote 250 mg 1 tablet p.o. three times daily.  3. Aspirin 81 mg p.o. daily.  4. Donepezil 5 mg p.o. daily.   DISCHARGE DIET: Regular diet.   DISCHARGE ACTIVITY: As tolerated.   FOLLOWUP INSTRUCTIONS:  1. Follow up with Dr. Manuella Ghazi in 1 to 2 weeks.  2. Primary care physician follow-up in 3 to 4 weeks.  3. Home health nursing, occupational therapy, dementia care, and social worker. 4. Seizure precautions.   THE FOLLOWING SEIZURE PRECAUTIONS WERE EXPLAINED BY DR. Sus[place]hanna Depot: During a seizure, do not force anything into the mouth. Do not give water or medicine until seizure is over. Do not try to stop jerking movements. Call 911 if the patient has prolonged seizure for more than 2 to 4 minutes or the patient does not regain consciousness between the seizures.   SEIZURE ADVICE: Keep seizure daily log. Take your medications regularly. Do not stop antiseizure medication abruptly. Avoid alcohol. Avoid sleep deprivation. Try to find out triggers for seizures like flashing lights or stress and try to avoid them. Avoid unsafe areas such as swimming by yourself, going on the roof, etc, so if you have a seizure at that time you might injure yourself. Legally driving is not permitted in the state of [place] for 6 to 12 months after last seizure.   LABS/RADIOLOGIC STUDIES: WBC 8.1, hemoglobin 10.7, hematocrit 31.6, and platelet count 398.   Sodium 144,  potassium 3.6, chloride 111, bicarbonate 27, BUN 11, creatinine 0.72, glucose 88, and calcium 8.2. Magnesium 1.7, that was replaced.   Vitamin B12 level is within normal limits at 477. Folic acid is pending.   MRI of the brain is showing chronic ischemic changes with right maxillary sinusitis.   Urinalysis is negative for infection.   CT of the head is showing sustained lacunar infarctions in the right basal ganglia. No acute ischemic or hemorrhagic infarction. No mass or hydrocephalus. Mild diffuse cerebral atrophy and cerebral atrophy with compensatory ventriculomegaly present.   EEG done on 06/07/2012 is showing diffuse slowing indicative of bihemispheric dysfunction of nonspecific etiology. Isolated sharply contoured discharge is also seen in the left temporal head region of uncertain significance.   BRIEF HOSPITAL COURSE: Ms. Rampersaud is a 74 year old Caucasian female who has been living independently by herself with history of seizure disorder following head trauma at age 40 who was brought to the hospital secondary to three to four witnessed seizure activities and postictal confusion. Her son also recently decided to move in from [place]. Not sure what the triggering factor is at this time, but the has remained seizure free while in the hospital. She was seen by Dr. Jennings Books while in the hospital and her son was resistant to changing or adding any new seizure medications like Keppra or Vimpat at this time. The patient's seizures were well controlled and she is on her home medications of phenobarbital and Depakote at the prior doses. She will be seeing Dr. Manuella Ghazi as an outpatient. She also has underlying mild  dementia and on Aricept. She will be discharged home and home health will be following her with nursing, dementia care, and also occupational therapy and a Education officer, museum. The son said he would stay at home until she sees Dr. Manuella Ghazi for her follow-up appointment to make sure everything is  stable. The course has been otherwise uneventful while in the hospital.   DISCHARGE CONDITION: Stable.   DISCHARGE DISPOSITION: Home.   TIME SPENT: 45 minutes.  ____________________________ Gladstone Lighter, MD rk:slb D: 06/09/2012 15:30:33 ET T: 06/10/2012 09:50:54 ET JOB#: [id number]  cc: Gladstone Lighter, MD, <Dictator> Hemang K. Manuella Ghazi, MD Dr. Danise Mina [facility] Gladstone Lighter MD ELECTRONICALLY SIGNED 06/12/2012 13:53

## 2014-10-23 NOTE — H&P (Signed)
PATIENT NAME:  Hannah, Howell MR#:  [id number] DATE OF BIRTH:  1940-09-24  DATE OF ADMISSION:  06/06/2012  PRIMARY CARE PHYSICIAN:  Dr. Halina Maidens    CHIEF COMPLAINT:  Seizures and altered mental status.  HISTORY OF PRESENT ILLNESS:  Hannah Howell is a very nice 74 year old female with history of seizure disorder since age 39 who has been living independently all her life up until recently; with changes in her mental capacity her son decided to move in from [place].   Apparently he has been taking care of her in the last week.  He has been doing his best to arrange her care while he goes back to [place] and finishes moving to [place].  Apparently the patient has been hallucinating and having significant decline of her mental capacities for which he has been very concerned. He has taken her to her primary care physician to do tests and exams and the only thing that he has been told is that her vitamin D level was really low for which vitamin D was prescribed.   The patient apparently has improved a little bit in her capacity within the time that her son has been in the house and he has arranged Home Health and a lot of care for her. Unfortunately, he left to go to [place] the last day? and when he came back last night he found her on the bed having a tonic-clonic seizure, which was significant. She woke up and was postictal and then today she had another seizure and another seizure also witnessed in the ER.  The son is very concerned about his mother's care and he told me that she might have had dementia for the last ten years after she had a large GI surgery in [place] due to a gastrointestinal cancer.   The patient has not been taking any medications for this and he is a very stressed out because she has not let him take care of her. All that he can tell me is that she has been overall independent. She is admitted because she had three seizures. Her Depakote level was low. She is  mildly dehydrated and postictal.   REVIEW OF SYSTEMS: Unable to obtain a full review of systems due to the patient's postictal status and mild confusion. All the history was obtained through her son.    PAST MEDICAL HISTORY:  1. Seizure disorder since age 44.  2. Gastrointestinal cancer 10 years ago, apparently stomach.  3. Dementia.   SOCIAL HISTORY: The patient lives independently and she always has all her life. She has a daughter and a son. Her son is moving from [place] to take care of her due to worsening dementia and decline of mental function. She used to smoke but she only smoked for couple of years and that was 30 years ago. She does not drink. She does not use any drugs. She was taking care of her finances. She is retired from [employer].   PAST SURGICAL HISTORY:  She had GI surgery, apparently, removed part of her stomach. Her cancer was diffuse to different organs that her son cannot tell me much about. Her surgery was in [place].   FAMILY HISTORY: Positive for heart attack in her mother. There is nobody else with a seizure disorder.   MEDICATIONS:  1. Phenobarbital 32.4 mg 3 times a day. 2. Depakote ER 250 mg 3 times a day.   ALLERGIES: No known drug allergies.   PHYSICAL EXAMINATION:  VITAL  SIGNS: Blood pressure 141/65, pulse 87, respiratory rate 16, temperature 98.5.   GENERAL: Patient is lethargic. She is able to answer a couple of questions with yes or no. She opens her eyes and follows very simple commands. Her son is at her side. No signs of sepsis.  HEENT: Pupils are equal and reactive. Extraocular movements are intact. Mucosa slightly dry. Anicteric sclerae. Pink conjunctivae. No oral lesions. No oropharyngeal exudates. No thrush.   NECK: Supple. No JVD. No thyromegaly. No adenopathy. No carotid  bruits. Normal range of motion. No masses. Trachea central.   CARDIOVASCULAR: Regular rate and rhythm. No murmurs, rubs, or gallops. No displacement of PMI. No  thrills.   LUNGS: Clear without any wheezing or crepitus. No dullness to percussion. No use of accessory muscles.   ABDOMEN: Soft, nontender, nondistended. No hepatosplenomegaly. No masses. Bowel sounds are positive. Incision from previous surgery is healed.   GENITAL: Deferred.   EXTREMITIES: No edema, no cyanosis, no clubbing.   MUSCULOSKELETAL: No joint effusions. No tenderness to mobilization of joints. No significant hematomas or bruises. Pulses +2. Capillary refill less than 3.   NEUROLOGIC: Cranial nerves II through XII seem intact. The patient is a slow to react and lethargic in a postictal state. No focal deficits.   PSYCH:  The patient is lethargic. No signs of agitation.   LYMPHATIC: Negative for lymphadenopathy in the neck or supraclavicular areas.   SKIN: Without any rashes or petechiae.   LABORATORY, DIAGNOSTIC, AND RADIOLOGICAL DATA: Glucose 120. Electrolytes within normal limits. Creatinine 0.8. LFTs within normal limits. Valproic acid is decreased at 31. White blood cells 15.8, hemoglobin 12.1, MCV and MCH are elevated but no significant anemia. Hemoglobin is 12. Urinalysis: No signs of infection. White count 15,000. CT of the head show some old lacunar infarcts, mild diffuse cerebral and cerebellar atrophy with compensatory ventriculomegaly.  The lacunar infarction in the periphery of the right basal ganglia is new since previous study done last year.   ASSESSMENT AND PLAN: 74 year old female with history of seizures and dementia admitted due to three seizures within the last 24 hours and significant decline of her mental capacity.  1. Seizure disorder: The patient had three seizures within the last 24 hours. She is on Depakote and her Depakote level is decreased. She is not able to give me more information but her son is here with her. He is very anxious. He is very preoccupied? with his mom. He tells me that he is not quite sure if she has been taking all her medications.  He has been doing his best within the last week that he has been around to take care of her but he needs to follow her around all day trying to feed her and give her medications. He is not sure if she was taking the right dose.  I am not going to change her medications right now, but I am going to ask neurology to come and evaluate the patient with two goals- one, help set up her medication management for depression, and also possibly start new medications for dementia. I think she will benefit from taking Namenda since her dementia is likely vascular due to new lacunar infarcts. She does not have a history of stroke in the past and she has not been taking aspirin. She should benefit with low dose aspirin as well. I am not going to get an MRI right now, but I will let neurology decide if this will help [place] establish any more information  for her care.  2. Dementia: The patient has significant decline of her mental capacities. Apparently she started having symptoms 10 years ago but within the last month she has been having hallucinations and having significant decline. I cannot see any other reasons-  infection or other type of situation that will bring on acute delirium. Mostly I think this is a chronic process which is getting worse. Although her urine test does not show any significant findings of urinary tract infection, I am going to order a urine culture just to make sure. I am going to order a chest x-ray as well just to make sure she does not have any consolidation there.  She does have history of a gastrointestinal cancer and there are no signs of metastatic disease right there. Apparently the patient has vitamin B12 deficiency and she is being treated with vitamin D.   3. She has elevated MCV and MCH. The patient is already taking vitamin B12.  4. Deep vein thrombosis prophylaxis: Heparin. 5. GI prophylaxis: Protonix.  TIME SPENT: 50 minutes.  I had a long conversation with the son.     ____________________________ [place] Sink, MD rsg:bjt D: 06/06/2012 17:22:30 ET T: 06/07/2012 07:23:50 ET JOB#: [id number]  cc:  Sink, MD, <Dictator> Halina Maidens, MD Cristi Loron MD ELECTRONICALLY SIGNED 06/07/2012 14:04

## 2014-11-16 DIAGNOSIS — G309 Alzheimer's disease, unspecified: Secondary | ICD-10-CM | POA: Diagnosis not present

## 2014-11-16 DIAGNOSIS — R569 Unspecified convulsions: Secondary | ICD-10-CM | POA: Diagnosis not present

## 2014-11-16 DIAGNOSIS — F0150 Vascular dementia without behavioral disturbance: Secondary | ICD-10-CM | POA: Diagnosis not present

## 2014-11-16 DIAGNOSIS — Z79899 Other long term (current) drug therapy: Secondary | ICD-10-CM | POA: Diagnosis not present

## 2014-11-19 ENCOUNTER — Ambulatory Visit (INDEPENDENT_AMBULATORY_CARE_PROVIDER_SITE_OTHER): Payer: Medicare Other | Admitting: Family Medicine

## 2014-11-19 ENCOUNTER — Encounter: Payer: Self-pay | Admitting: Family Medicine

## 2014-11-19 VITALS — BP 130/80 | HR 105 | Temp 98.3°F | Ht 63.25 in | Wt 159.8 lb

## 2014-11-19 DIAGNOSIS — Z8579 Personal history of other malignant neoplasms of lymphoid, hematopoietic and related tissues: Secondary | ICD-10-CM | POA: Diagnosis not present

## 2014-11-19 DIAGNOSIS — E785 Hyperlipidemia, unspecified: Secondary | ICD-10-CM

## 2014-11-19 DIAGNOSIS — R8290 Unspecified abnormal findings in urine: Secondary | ICD-10-CM

## 2014-11-19 DIAGNOSIS — R3915 Urgency of urination: Secondary | ICD-10-CM | POA: Diagnosis not present

## 2014-11-19 DIAGNOSIS — R569 Unspecified convulsions: Secondary | ICD-10-CM | POA: Diagnosis not present

## 2014-11-19 DIAGNOSIS — R413 Other amnesia: Secondary | ICD-10-CM | POA: Diagnosis not present

## 2014-11-19 DIAGNOSIS — Z8572 Personal history of non-Hodgkin lymphomas: Secondary | ICD-10-CM

## 2014-11-19 DIAGNOSIS — I10 Essential (primary) hypertension: Secondary | ICD-10-CM

## 2014-11-19 DIAGNOSIS — R8299 Other abnormal findings in urine: Secondary | ICD-10-CM | POA: Diagnosis not present

## 2014-11-19 LAB — POCT URINALYSIS DIPSTICK
Bilirubin, UA: NEGATIVE
GLUCOSE UA: NEGATIVE
KETONES UA: NEGATIVE
Nitrite, UA: NEGATIVE
SPEC GRAV UA: 1.025
UROBILINOGEN UA: 0.2
pH, UA: 6

## 2014-11-19 NOTE — Progress Notes (Addendum)
BP 130/80 mmHg  Pulse 105  Temp(Src) 98.3 F (36.8 C) (Oral)  Ht 5' 3.25" (1.607 m)  Wt 159 lb 12.8 oz (72.485 kg)  BMI 28.07 kg/m2  SpO2 92%   CC: f/u visit - not seen since 12/2012 Subjective:    Patient ID: Hannah Howell, female    DOB: 06/27/1941, 74 y.o.   MRN: [id number]  HPI: MARGARETA LAUREANO is a 74 y.o. female presenting on 11/19/2014 for Annual Exam   Here with son today who lives in [place]. He will be here until June 5th.  Last visit here 12/2012 for medicare wellness visit. Did keep appointments while son and daughter were here, but then never returned for f/u on her own. Missed last scheduled appointment on 10/05/2014.   Since last seen here, lost to f/u then saw Dr Manuella Ghazi twice latest 11/16/2014. She also had ER evaluation after confusion and getting lost at gas station. Records reviewed. She is currently on seroquel, keppra, depakote, and aricept 5mg  daily.   Doyce Loose keeps an eye on her but doesn't live with her.   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  Son remains worried about her ability for med administration as well as living safely alone.  Son also endorses several week history of urinary urgency but patient denies dysuria.  Relevant past medical, surgical, family and social history reviewed and updated as indicated. Interim medical history since our last visit reviewed. Allergies and medications reviewed and updated. Current Outpatient Prescriptions on File Prior to Visit  Medication Sig  . aspirin 81 MG tablet Take 81 mg by mouth daily.  . Calcium Carb-Cholecalciferol (CALCIUM-VITAMIN D) 600-400 MG-UNIT TABS Take 1 tablet by mouth 2 (two) times daily.  . cholecalciferol (VITAMIN D) 1000 UNITS tablet Take 1,000 Units by mouth daily.  . divalproex (DEPAKOTE) 250 MG DR tablet Take 1 tablet (250 mg total) by mouth 3 (three) times daily.  Marland Kitchen donepezil (ARICEPT) 5 MG tablet Take 5 mg by mouth daily.  . fish  oil-omega-3 fatty acids 1000 MG capsule Take 1 g by mouth daily.  . vitamin B-12 (CYANOCOBALAMIN) 1000 MCG tablet Take 1,000 mcg by mouth daily.   No current facility-administered medications on file prior to visit.    Review of Systems Per HPI unless specifically indicated above     Objective:    BP 130/80 mmHg  Pulse 105  Temp(Src) 98.3 F (36.8 C) (Oral)  Ht 5' 3.25" (1.607 m)  Wt 159 lb 12.8 oz (72.485 kg)  BMI 28.07 kg/m2  SpO2 92%  Wt Readings from Last 3 Encounters:  11/19/14 159 lb 12.8 oz (72.485 kg)  01/30/13 124 lb (56.246 kg)  01/24/13 124 lb (56.246 kg)    Physical Exam  Constitutional: She appears well-developed and well-nourished. No distress.  HENT:  Mouth/Throat: Oropharynx is clear and moist. No oropharyngeal exudate.  Eyes: Conjunctivae and EOM are normal. Pupils are equal, round, and reactive to light. No scleral icterus.  Neck: Normal range of motion. Neck supple.  Cardiovascular: Normal rate, regular rhythm, normal heart sounds and intact distal pulses.   No murmur heard. Pulmonary/Chest: Effort normal and breath sounds normal. No respiratory distress. She has no wheezes. She has no rales.  Musculoskeletal: She exhibits no edema.  Lymphadenopathy:    She has no cervical adenopathy.  Neurological:  Station and gait grossly intact  Skin: Skin is warm and dry. No rash noted.  Psychiatric: She has a normal mood and  affect. Her behavior is normal.  Nursing note and vitals reviewed.  Results for orders placed or performed in visit on 11/19/14  Valproic acid level  Result Value Ref Range   Valproic Acid Lvl 61.2 50.0 - 100.0 ug/mL  POCT Urinalysis Dipstick  Result Value Ref Range   Color, UA yellow    Clarity, UA clear    Glucose, UA neg    Bilirubin, UA neg    Ketones, UA neg    Spec Grav, UA 1.025    Blood, UA trace    pH, UA 6.0    Protein, UA trace    Urobilinogen, UA 0.2    Nitrite, UA neg    Leukocytes, UA large (3+)       Assessment  & Plan:  Passes vision screen today. Hearing screen not performed due to broken machine.  Problem List Items Addressed This Visit    History of lymphoma    Check CBC today.      Relevant Orders   CBC with Differential/Platelet   Hypertension    Now off HCTZ and blood pressure remains stable. Will not restart.      Memory deficit    See above. ?vascular vs alzheimer's dementia. 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 Continue aricept 5mg  daily. Son endorses improvement in status when she's compliant with her medications. Difficult situation as noncompliance with seizure meds obfuscates question of memory loss Discussed I along with Dr Manuella Ghazi don't feel pt is safe to live alone. Discussed options including nurse aide to come out daily to help with med administration vs transition to higher level of care (ALF). Pt would like to stay at home - will start with [facility], but discussed looking into private company for daily nurses' aid.      Relevant Orders   Vitamin B12   Ambulatory referral to Home Health   Seizures - Primary    Emphasized importance of regularly taking antiepileptics as prescribed. There is concern with patient's ability to appropriately take her medications. Dr. Manuella Ghazi has commenced [facility] referral but pt hasn't heard anything from them - will place another order for [facility] so my referral coordinator can help coordinate this with neurology office.      Relevant Medications   levETIRAcetam (KEPPRA) 100 MG/ML solution   Other Relevant Orders   Comprehensive metabolic panel   TSH   Vitamin B12   Valproic acid level (Completed)   Ambulatory referral to Home Health   Urinary urgency    Check UA today - concerning for infection with 3+ LE and tr blood - urine culture sent and will await results.      Relevant Orders   POCT Urinalysis Dipstick (Completed)   Urine Culture    Other Visit Diagnoses    HLD (hyperlipidemia)        Relevant Orders    Lipid panel    Urine  abnormality        Relevant Orders    Urine Culture        Follow up plan: Return in about 2 months (around 01/19/2015), or as needed, for follow up visit.

## 2014-11-19 NOTE — Patient Instructions (Addendum)
labwork today. Hearing and vision screen today. Return in 2 months for follow up. We will call you when forms are ready to pick up (make sure we have a good # for you up front). Urinalysis today.

## 2014-11-19 NOTE — Progress Notes (Signed)
Pre visit review using our clinic review tool, if applicable. No additional management support is needed unless otherwise documented below in the visit note. 

## 2014-11-20 ENCOUNTER — Encounter: Payer: Self-pay | Admitting: Family Medicine

## 2014-11-20 LAB — CBC WITH DIFFERENTIAL/PLATELET
Basophils Absolute: 0 10*3/uL (ref 0.0–0.1)
Basophils Relative: 0.3 % (ref 0.0–3.0)
Eosinophils Absolute: 0.4 10*3/uL (ref 0.0–0.7)
Eosinophils Relative: 3.9 % (ref 0.0–5.0)
HEMATOCRIT: 41.8 % (ref 36.0–46.0)
HEMOGLOBIN: 14 g/dL (ref 12.0–15.0)
LYMPHS PCT: 46.9 % — AB (ref 12.0–46.0)
Lymphs Abs: 4.9 10*3/uL — ABNORMAL HIGH (ref 0.7–4.0)
MCHC: 33.4 g/dL (ref 30.0–36.0)
MCV: 97.9 fl (ref 78.0–100.0)
MONOS PCT: 7.8 % (ref 3.0–12.0)
Monocytes Absolute: 0.8 10*3/uL (ref 0.1–1.0)
NEUTROS ABS: 4.3 10*3/uL (ref 1.4–7.7)
Neutrophils Relative %: 41.1 % — ABNORMAL LOW (ref 43.0–77.0)
Platelets: 370 10*3/uL (ref 150.0–400.0)
RBC: 4.28 Mil/uL (ref 3.87–5.11)
RDW: 14 % (ref 11.5–15.5)
WBC: 10.4 10*3/uL (ref 4.0–10.5)

## 2014-11-20 LAB — COMPREHENSIVE METABOLIC PANEL
ALK PHOS: 76 U/L (ref 39–117)
ALT: 14 U/L (ref 0–35)
AST: 27 U/L (ref 0–37)
Albumin: 4.3 g/dL (ref 3.5–5.2)
BILIRUBIN TOTAL: 0.4 mg/dL (ref 0.2–1.2)
BUN: 28 mg/dL — ABNORMAL HIGH (ref 6–23)
CO2: 30 mEq/L (ref 19–32)
CREATININE: 1.1 mg/dL (ref 0.40–1.20)
Calcium: 9.8 mg/dL (ref 8.4–10.5)
Chloride: 104 mEq/L (ref 96–112)
GFR: 51.47 mL/min — ABNORMAL LOW (ref >60.00)
Glucose, Bld: 79 mg/dL (ref 70–99)
POTASSIUM: 4.3 meq/L (ref 3.5–5.1)
SODIUM: 141 meq/L (ref 135–145)
Total Protein: 7.3 g/dL (ref 6.0–8.3)

## 2014-11-20 LAB — LIPID PANEL
CHOLESTEROL: 219 mg/dL — AB (ref 0–200)
HDL: 36 mg/dL — AB (ref >39.00)
NonHDL: 183
TRIGLYCERIDES: 224 mg/dL — AB (ref 0.0–149.0)
Total CHOL/HDL Ratio: 6
VLDL: 44.8 mg/dL — AB (ref 0.0–40.0)

## 2014-11-20 LAB — VALPROIC ACID LEVEL: Valproic Acid Lvl: 61.2 ug/mL (ref 50.0–100.0)

## 2014-11-20 LAB — TSH: TSH: 1.61 u[IU]/mL (ref 0.35–4.50)

## 2014-11-20 LAB — LDL CHOLESTEROL, DIRECT: Direct LDL: 133 mg/dL

## 2014-11-20 LAB — VITAMIN B12: Vitamin B-12: 1385 pg/mL — ABNORMAL HIGH (ref 211–911)

## 2014-11-20 NOTE — Assessment & Plan Note (Addendum)
See above. ?vascular vs alzheimer's dementia. 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 Continue aricept 5mg  daily. Son endorses improvement in status when she's compliant with her medications. Difficult situation as noncompliance with seizure meds obfuscates question of memory loss Discussed I along with Dr Manuella Ghazi don't feel pt is safe to live alone. Discussed options including nurse aide to come out daily to help with med administration vs transition to higher level of care (ALF). Pt would like to stay at home - will start with [facility], but discussed looking into private company for daily nurses' aid.

## 2014-11-20 NOTE — Addendum Note (Signed)
Addended by: Ria Bush on: 11/20/2014 07:38 AM   Modules accepted: Miquel Dunn

## 2014-11-20 NOTE — Assessment & Plan Note (Signed)
Emphasized importance of regularly taking antiepileptics as prescribed. There is concern with patient's ability to appropriately take her medications. Dr. Manuella Ghazi has commenced [facility] referral but pt hasn't heard anything from them - will place another order for [facility] so my referral coordinator can help coordinate this with neurology office.

## 2014-11-20 NOTE — Assessment & Plan Note (Signed)
Now off HCTZ and blood pressure remains stable. Will not restart.

## 2014-11-20 NOTE — Assessment & Plan Note (Signed)
Check CBC today.  

## 2014-11-20 NOTE — Assessment & Plan Note (Signed)
Check UA today - concerning for infection with 3+ LE and tr blood - urine culture sent and will await results.

## 2014-11-21 LAB — URINE CULTURE

## 2014-11-26 ENCOUNTER — Telehealth: Payer: Self-pay

## 2014-11-26 NOTE — Telephone Encounter (Signed)
plz ask them to contact pt's son who is currently with her. [place], [place] Son [phone]

## 2014-11-26 NOTE — Telephone Encounter (Signed)
Left message for Sheila to return call.  

## 2014-11-26 NOTE — Telephone Encounter (Signed)
Spoke to [place], contact information given.

## 2014-11-26 NOTE — Telephone Encounter (Signed)
Freda Munro nurse with [facility] left v/m;Freda Munro has been trying to reach pt since 11/22/14 without success;  FYI to let Dr Darnell Level know pt has not been admitted to home care yet.

## 2014-11-27 ENCOUNTER — Telehealth: Payer: Self-pay

## 2014-11-27 DIAGNOSIS — F0390 Unspecified dementia without behavioral disturbance: Secondary | ICD-10-CM | POA: Diagnosis not present

## 2014-11-27 DIAGNOSIS — Z87891 Personal history of nicotine dependence: Secondary | ICD-10-CM | POA: Diagnosis not present

## 2014-11-27 DIAGNOSIS — Z8572 Personal history of non-Hodgkin lymphomas: Secondary | ICD-10-CM | POA: Diagnosis not present

## 2014-11-27 DIAGNOSIS — I10 Essential (primary) hypertension: Secondary | ICD-10-CM | POA: Diagnosis not present

## 2014-11-27 DIAGNOSIS — G309 Alzheimer's disease, unspecified: Secondary | ICD-10-CM | POA: Diagnosis not present

## 2014-11-27 DIAGNOSIS — Z8673 Personal history of transient ischemic attack (TIA), and cerebral infarction without residual deficits: Secondary | ICD-10-CM | POA: Diagnosis not present

## 2014-11-27 DIAGNOSIS — G40909 Epilepsy, unspecified, not intractable, without status epilepticus: Secondary | ICD-10-CM | POA: Diagnosis not present

## 2014-11-27 NOTE — Telephone Encounter (Signed)
Melissa from [facility] said that they finally got in touch with patients son and will go out to the home tomorrow, 11/28/14.

## 2014-11-27 NOTE — Telephone Encounter (Signed)
Noted. Thanks.

## 2014-11-27 NOTE — Telephone Encounter (Signed)
[name] nurse with [facility] left v/m requesting verbal order for home health speech therapy referral to assist with dementia and altzheimers.Please advise.

## 2014-11-27 NOTE — Telephone Encounter (Signed)
Left message for Hannah Howell to return call.

## 2014-11-27 NOTE — Telephone Encounter (Signed)
Spoke to [place], verbal order given.  She would also like to let MD know that the patient has refused a home health aide.  Her son is aware and states he will hire someone to come "sit" with her during the day.

## 2014-11-27 NOTE — Telephone Encounter (Signed)
Ok to do thanks. 

## 2014-11-29 DIAGNOSIS — Z87891 Personal history of nicotine dependence: Secondary | ICD-10-CM | POA: Diagnosis not present

## 2014-11-29 DIAGNOSIS — G309 Alzheimer's disease, unspecified: Secondary | ICD-10-CM | POA: Diagnosis not present

## 2014-11-29 DIAGNOSIS — G40909 Epilepsy, unspecified, not intractable, without status epilepticus: Secondary | ICD-10-CM | POA: Diagnosis not present

## 2014-11-29 DIAGNOSIS — F0390 Unspecified dementia without behavioral disturbance: Secondary | ICD-10-CM | POA: Diagnosis not present

## 2014-11-29 DIAGNOSIS — Z8673 Personal history of transient ischemic attack (TIA), and cerebral infarction without residual deficits: Secondary | ICD-10-CM | POA: Diagnosis not present

## 2014-11-29 DIAGNOSIS — I10 Essential (primary) hypertension: Secondary | ICD-10-CM | POA: Diagnosis not present

## 2014-11-30 DIAGNOSIS — F0390 Unspecified dementia without behavioral disturbance: Secondary | ICD-10-CM | POA: Diagnosis not present

## 2014-11-30 DIAGNOSIS — G40909 Epilepsy, unspecified, not intractable, without status epilepticus: Secondary | ICD-10-CM | POA: Diagnosis not present

## 2014-11-30 DIAGNOSIS — G309 Alzheimer's disease, unspecified: Secondary | ICD-10-CM | POA: Diagnosis not present

## 2014-11-30 DIAGNOSIS — I10 Essential (primary) hypertension: Secondary | ICD-10-CM | POA: Diagnosis not present

## 2014-11-30 DIAGNOSIS — Z8673 Personal history of transient ischemic attack (TIA), and cerebral infarction without residual deficits: Secondary | ICD-10-CM | POA: Diagnosis not present

## 2014-11-30 DIAGNOSIS — Z87891 Personal history of nicotine dependence: Secondary | ICD-10-CM | POA: Diagnosis not present

## 2014-12-05 ENCOUNTER — Telehealth: Payer: Self-pay

## 2014-12-05 DIAGNOSIS — G309 Alzheimer's disease, unspecified: Secondary | ICD-10-CM | POA: Diagnosis not present

## 2014-12-05 DIAGNOSIS — F0390 Unspecified dementia without behavioral disturbance: Secondary | ICD-10-CM | POA: Diagnosis not present

## 2014-12-05 DIAGNOSIS — Z8673 Personal history of transient ischemic attack (TIA), and cerebral infarction without residual deficits: Secondary | ICD-10-CM | POA: Diagnosis not present

## 2014-12-05 DIAGNOSIS — Z87891 Personal history of nicotine dependence: Secondary | ICD-10-CM | POA: Diagnosis not present

## 2014-12-05 DIAGNOSIS — G40909 Epilepsy, unspecified, not intractable, without status epilepticus: Secondary | ICD-10-CM | POA: Diagnosis not present

## 2014-12-05 DIAGNOSIS — I10 Essential (primary) hypertension: Secondary | ICD-10-CM | POA: Diagnosis not present

## 2014-12-05 NOTE — Telephone Encounter (Signed)
Amy with [facility] left v/m requesting verbal orders for home health OT evaluate and treat.Please advise.

## 2014-12-05 NOTE — Telephone Encounter (Signed)
Ok to do, thanks. 

## 2014-12-06 DIAGNOSIS — Z87891 Personal history of nicotine dependence: Secondary | ICD-10-CM | POA: Diagnosis not present

## 2014-12-06 DIAGNOSIS — Z8673 Personal history of transient ischemic attack (TIA), and cerebral infarction without residual deficits: Secondary | ICD-10-CM | POA: Diagnosis not present

## 2014-12-06 DIAGNOSIS — G309 Alzheimer's disease, unspecified: Secondary | ICD-10-CM | POA: Diagnosis not present

## 2014-12-06 DIAGNOSIS — F0390 Unspecified dementia without behavioral disturbance: Secondary | ICD-10-CM | POA: Diagnosis not present

## 2014-12-06 DIAGNOSIS — G40909 Epilepsy, unspecified, not intractable, without status epilepticus: Secondary | ICD-10-CM | POA: Diagnosis not present

## 2014-12-06 DIAGNOSIS — I10 Essential (primary) hypertension: Secondary | ICD-10-CM | POA: Diagnosis not present

## 2014-12-06 NOTE — Telephone Encounter (Signed)
Amy notified

## 2014-12-07 ENCOUNTER — Telehealth: Payer: Self-pay

## 2014-12-07 NOTE — Telephone Encounter (Signed)
Santiago Glad Education officer, museum with [facility] said pt was reported to adult protective services due to pt safety in the home; Santiago Glad did not feel pt should be in the home 1 week by herself. Santiago Glad does not need cb and this is FYI for Dr Darnell Level. Adult services should take care of issue.

## 2014-12-07 NOTE — Telephone Encounter (Signed)
Noted! Thank you

## 2014-12-11 DIAGNOSIS — G309 Alzheimer's disease, unspecified: Secondary | ICD-10-CM | POA: Diagnosis not present

## 2014-12-11 DIAGNOSIS — I10 Essential (primary) hypertension: Secondary | ICD-10-CM | POA: Diagnosis not present

## 2014-12-11 DIAGNOSIS — F0390 Unspecified dementia without behavioral disturbance: Secondary | ICD-10-CM | POA: Diagnosis not present

## 2014-12-11 DIAGNOSIS — G40909 Epilepsy, unspecified, not intractable, without status epilepticus: Secondary | ICD-10-CM | POA: Diagnosis not present

## 2014-12-11 DIAGNOSIS — Z87891 Personal history of nicotine dependence: Secondary | ICD-10-CM | POA: Diagnosis not present

## 2014-12-11 DIAGNOSIS — Z8673 Personal history of transient ischemic attack (TIA), and cerebral infarction without residual deficits: Secondary | ICD-10-CM | POA: Diagnosis not present

## 2014-12-13 DIAGNOSIS — G40909 Epilepsy, unspecified, not intractable, without status epilepticus: Secondary | ICD-10-CM | POA: Diagnosis not present

## 2014-12-13 DIAGNOSIS — F0390 Unspecified dementia without behavioral disturbance: Secondary | ICD-10-CM | POA: Diagnosis not present

## 2014-12-13 DIAGNOSIS — G309 Alzheimer's disease, unspecified: Secondary | ICD-10-CM | POA: Diagnosis not present

## 2014-12-13 DIAGNOSIS — Z8673 Personal history of transient ischemic attack (TIA), and cerebral infarction without residual deficits: Secondary | ICD-10-CM | POA: Diagnosis not present

## 2014-12-13 DIAGNOSIS — I10 Essential (primary) hypertension: Secondary | ICD-10-CM | POA: Diagnosis not present

## 2014-12-13 DIAGNOSIS — Z87891 Personal history of nicotine dependence: Secondary | ICD-10-CM | POA: Diagnosis not present

## 2014-12-17 DIAGNOSIS — G309 Alzheimer's disease, unspecified: Secondary | ICD-10-CM | POA: Diagnosis not present

## 2014-12-17 DIAGNOSIS — Z8673 Personal history of transient ischemic attack (TIA), and cerebral infarction without residual deficits: Secondary | ICD-10-CM | POA: Diagnosis not present

## 2014-12-17 DIAGNOSIS — F0390 Unspecified dementia without behavioral disturbance: Secondary | ICD-10-CM | POA: Diagnosis not present

## 2014-12-17 DIAGNOSIS — Z87891 Personal history of nicotine dependence: Secondary | ICD-10-CM | POA: Diagnosis not present

## 2014-12-17 DIAGNOSIS — G40909 Epilepsy, unspecified, not intractable, without status epilepticus: Secondary | ICD-10-CM | POA: Diagnosis not present

## 2014-12-17 DIAGNOSIS — I10 Essential (primary) hypertension: Secondary | ICD-10-CM | POA: Diagnosis not present

## 2014-12-18 DIAGNOSIS — F0390 Unspecified dementia without behavioral disturbance: Secondary | ICD-10-CM | POA: Diagnosis not present

## 2014-12-18 DIAGNOSIS — G40909 Epilepsy, unspecified, not intractable, without status epilepticus: Secondary | ICD-10-CM | POA: Diagnosis not present

## 2014-12-18 DIAGNOSIS — Z87891 Personal history of nicotine dependence: Secondary | ICD-10-CM | POA: Diagnosis not present

## 2014-12-18 DIAGNOSIS — Z8673 Personal history of transient ischemic attack (TIA), and cerebral infarction without residual deficits: Secondary | ICD-10-CM | POA: Diagnosis not present

## 2014-12-18 DIAGNOSIS — I10 Essential (primary) hypertension: Secondary | ICD-10-CM | POA: Diagnosis not present

## 2014-12-18 DIAGNOSIS — G309 Alzheimer's disease, unspecified: Secondary | ICD-10-CM | POA: Diagnosis not present

## 2014-12-19 DIAGNOSIS — G40909 Epilepsy, unspecified, not intractable, without status epilepticus: Secondary | ICD-10-CM | POA: Diagnosis not present

## 2014-12-19 DIAGNOSIS — F0390 Unspecified dementia without behavioral disturbance: Secondary | ICD-10-CM | POA: Diagnosis not present

## 2014-12-19 DIAGNOSIS — G309 Alzheimer's disease, unspecified: Secondary | ICD-10-CM | POA: Diagnosis not present

## 2014-12-19 DIAGNOSIS — I10 Essential (primary) hypertension: Secondary | ICD-10-CM | POA: Diagnosis not present

## 2014-12-21 ENCOUNTER — Telehealth: Payer: Self-pay | Admitting: Family Medicine

## 2014-12-21 DIAGNOSIS — I10 Essential (primary) hypertension: Secondary | ICD-10-CM | POA: Diagnosis not present

## 2014-12-21 DIAGNOSIS — G309 Alzheimer's disease, unspecified: Secondary | ICD-10-CM | POA: Diagnosis not present

## 2014-12-21 DIAGNOSIS — Z87891 Personal history of nicotine dependence: Secondary | ICD-10-CM | POA: Diagnosis not present

## 2014-12-21 DIAGNOSIS — Z8673 Personal history of transient ischemic attack (TIA), and cerebral infarction without residual deficits: Secondary | ICD-10-CM | POA: Diagnosis not present

## 2014-12-21 DIAGNOSIS — G40909 Epilepsy, unspecified, not intractable, without status epilepticus: Secondary | ICD-10-CM | POA: Diagnosis not present

## 2014-12-21 DIAGNOSIS — F0390 Unspecified dementia without behavioral disturbance: Secondary | ICD-10-CM | POA: Diagnosis not present

## 2014-12-21 NOTE — Telephone Encounter (Signed)
[facility] called -  Verbal orders to continue home health for speech.  [phone]

## 2014-12-21 NOTE — Telephone Encounter (Signed)
Ok to do. Thanks.  

## 2014-12-24 NOTE — Telephone Encounter (Signed)
Amy notified

## 2014-12-25 DIAGNOSIS — G309 Alzheimer's disease, unspecified: Secondary | ICD-10-CM | POA: Diagnosis not present

## 2014-12-25 DIAGNOSIS — Z8673 Personal history of transient ischemic attack (TIA), and cerebral infarction without residual deficits: Secondary | ICD-10-CM | POA: Diagnosis not present

## 2014-12-25 DIAGNOSIS — I10 Essential (primary) hypertension: Secondary | ICD-10-CM | POA: Diagnosis not present

## 2014-12-25 DIAGNOSIS — G40909 Epilepsy, unspecified, not intractable, without status epilepticus: Secondary | ICD-10-CM | POA: Diagnosis not present

## 2014-12-25 DIAGNOSIS — F0390 Unspecified dementia without behavioral disturbance: Secondary | ICD-10-CM | POA: Diagnosis not present

## 2014-12-25 DIAGNOSIS — Z87891 Personal history of nicotine dependence: Secondary | ICD-10-CM | POA: Diagnosis not present

## 2014-12-26 DIAGNOSIS — I10 Essential (primary) hypertension: Secondary | ICD-10-CM | POA: Diagnosis not present

## 2014-12-26 DIAGNOSIS — Z87891 Personal history of nicotine dependence: Secondary | ICD-10-CM | POA: Diagnosis not present

## 2014-12-26 DIAGNOSIS — G309 Alzheimer's disease, unspecified: Secondary | ICD-10-CM | POA: Diagnosis not present

## 2014-12-26 DIAGNOSIS — F0390 Unspecified dementia without behavioral disturbance: Secondary | ICD-10-CM | POA: Diagnosis not present

## 2014-12-26 DIAGNOSIS — Z8673 Personal history of transient ischemic attack (TIA), and cerebral infarction without residual deficits: Secondary | ICD-10-CM | POA: Diagnosis not present

## 2014-12-26 DIAGNOSIS — G40909 Epilepsy, unspecified, not intractable, without status epilepticus: Secondary | ICD-10-CM | POA: Diagnosis not present

## 2014-12-31 ENCOUNTER — Telehealth: Payer: Self-pay | Admitting: *Deleted

## 2014-12-31 NOTE — Telephone Encounter (Signed)
OT from [facility] needed verbal order to do discharge visit this week. Patient was not available last week on any of the days they out. Verbal order given.

## 2015-01-02 DIAGNOSIS — F0390 Unspecified dementia without behavioral disturbance: Secondary | ICD-10-CM | POA: Diagnosis not present

## 2015-01-02 DIAGNOSIS — G309 Alzheimer's disease, unspecified: Secondary | ICD-10-CM | POA: Diagnosis not present

## 2015-01-02 DIAGNOSIS — I10 Essential (primary) hypertension: Secondary | ICD-10-CM | POA: Diagnosis not present

## 2015-01-02 DIAGNOSIS — Z87891 Personal history of nicotine dependence: Secondary | ICD-10-CM | POA: Diagnosis not present

## 2015-01-02 DIAGNOSIS — G40909 Epilepsy, unspecified, not intractable, without status epilepticus: Secondary | ICD-10-CM | POA: Diagnosis not present

## 2015-01-02 DIAGNOSIS — Z8673 Personal history of transient ischemic attack (TIA), and cerebral infarction without residual deficits: Secondary | ICD-10-CM | POA: Diagnosis not present

## 2015-01-03 ENCOUNTER — Telehealth: Payer: Self-pay | Admitting: Family Medicine

## 2015-01-03 NOTE — Telephone Encounter (Signed)
Message left for Santiago Glad at [facility] to return my call.

## 2015-01-03 NOTE — Telephone Encounter (Signed)
Would this be a case for DSS since her son lives in [place]?

## 2015-01-03 NOTE — Telephone Encounter (Signed)
Can we touch base with [facility]? Adult protective services was involved a few weeks ago Santiago Glad SW with [facility] was contact person then) and I don't know results of this investigation.

## 2015-01-03 NOTE — Telephone Encounter (Signed)
D/c from home health speech therapy, therapist feels like she is not doing well at home. Dr Brigitte Pulse and others  have recommended assisted living,family will not agree. Pt only has caregiver 3 hrs day. Speech therapy discharging due to lack of care.  Pt missed last appt because she will not come to door.  [phone]

## 2015-01-09 NOTE — Telephone Encounter (Signed)
Spoke with Santiago Glad, SW at [facility]. She said she had turned over the case to APS and had not heard anything back from them. She said the son told her that they were getting a 24 hour nurse for her. I asked if she could go back out to patient's house and follow up to make sure this has been done and also follow up with [employer]PS. She said in order to go back out to residence, she would need an order from you. I asked if there was any way that her family could be "forced" by law to put her in ALF for her safety if the 24 hour caregiver hasn't been set up. She said if there was a letter by you stating that she is incompetent to care for herself, then yes. She is also extremely concerned for her safety and wellbeing and doesn't think she needs to be living alone any longer.

## 2015-01-18 ENCOUNTER — Ambulatory Visit (INDEPENDENT_AMBULATORY_CARE_PROVIDER_SITE_OTHER): Payer: Medicare Other | Admitting: Family Medicine

## 2015-01-18 ENCOUNTER — Encounter: Payer: Self-pay | Admitting: Family Medicine

## 2015-01-18 VITALS — BP 136/84 | HR 68 | Temp 98.3°F | Wt 161.5 lb

## 2015-01-18 DIAGNOSIS — Z639 Problem related to primary support group, unspecified: Secondary | ICD-10-CM

## 2015-01-18 DIAGNOSIS — Z8673 Personal history of transient ischemic attack (TIA), and cerebral infarction without residual deficits: Secondary | ICD-10-CM

## 2015-01-18 DIAGNOSIS — R413 Other amnesia: Secondary | ICD-10-CM

## 2015-01-18 DIAGNOSIS — I10 Essential (primary) hypertension: Secondary | ICD-10-CM | POA: Diagnosis not present

## 2015-01-18 DIAGNOSIS — R569 Unspecified convulsions: Secondary | ICD-10-CM | POA: Diagnosis not present

## 2015-01-18 NOTE — Patient Instructions (Addendum)
Call Dr Manuella Ghazi neurologist office to ensure you have appointment for November [phone] I do recommend assistance and supervision at home in order to stay safely at home. May consider daytime care at local community resource. Look into meals on wheels. You are doing well today. Return as needed or in 3 months for follow up.

## 2015-01-18 NOTE — Assessment & Plan Note (Signed)
Continue aspirin daily.  

## 2015-01-18 NOTE — Telephone Encounter (Signed)
See todays office note

## 2015-01-18 NOTE — Assessment & Plan Note (Signed)
Seems more consistent with med and subsequently no recent seizure activity. Encouraged continued f/u with neurology Dr Manuella Ghazi.

## 2015-01-18 NOTE — Assessment & Plan Note (Signed)
Chronic, stable off antihypertensives. Will remove from problem list.

## 2015-01-18 NOTE — Progress Notes (Signed)
BP 136/84 mmHg  Pulse 68  Temp(Src) 98.3 F (36.8 C)  Wt 161 lb 8 oz (73.256 kg)   CC: 1 mo f/u visit  Subjective:    Patient ID: Hannah Howell, female    DOB: [birth year], 74 y.o.   MRN: [id number]  HPI: KALENE CUTLER is a 74 y.o. female presenting on 01/18/2015 for Follow-up   See phone notes and office notes for details.  Presents with caregiver Ileene Rubens who comes to the house every morning at 7am-9pm to ensure she takes medications regularly. Eugena Rhue also helps with medicines and preparing food (9pm-7am). Doyce Loose comes out to house several times a week as well.   Pt remains adamant about wanting to stay at home. Family doesn't want assisted living either.   Pending f/u with neurologist 05/2015.  Relevant past medical, surgical, family and social history reviewed and updated as indicated. Interim medical history since our last visit reviewed. Allergies and medications reviewed and updated. Current Outpatient Prescriptions on File Prior to Visit  Medication Sig  . aspirin 81 MG tablet Take 81 mg by mouth daily.  . cholecalciferol (VITAMIN D) 1000 UNITS tablet Take 1,000 Units by mouth daily.  . divalproex (DEPAKOTE) 250 MG DR tablet Take 1 tablet (250 mg total) by mouth 3 (three) times daily. (Patient taking differently: Take 250 mg by mouth 2 (two) times daily. )  . donepezil (ARICEPT) 5 MG tablet Take 5 mg by mouth 2 (two) times daily.   . QUEtiapine (SEROQUEL) 50 MG tablet Take 50 mg by mouth at bedtime.  . vitamin B-12 (CYANOCOBALAMIN) 1000 MCG tablet Take 1,000 mcg by mouth daily.  . Calcium Carb-Cholecalciferol (CALCIUM-VITAMIN D) 600-400 MG-UNIT TABS Take 1 tablet by mouth 2 (two) times daily.  . fish oil-omega-3 fatty acids 1000 MG capsule Take 1 g by mouth daily.   No current facility-administered medications on file prior to visit.    Review of Systems Per HPI unless specifically indicated above     Objective:    BP 136/84 mmHg  Pulse 68   Temp(Src) 98.3 F (36.8 C)  Wt 161 lb 8 oz (73.256 kg)  Wt Readings from Last 3 Encounters:  01/18/15 161 lb 8 oz (73.256 kg)  11/19/14 159 lb 12.8 oz (72.485 kg)  01/30/13 124 lb (56.246 kg)   Body mass index is 28.37 kg/(m^2).  Physical Exam  Constitutional: She appears well-developed and well-nourished. No distress.  HENT:  Mouth/Throat: Oropharynx is clear and moist. No oropharyngeal exudate.  Cardiovascular: Normal rate, regular rhythm, normal heart sounds and intact distal pulses.   No murmur heard. Pulmonary/Chest: Effort normal and breath sounds normal. No respiratory distress. She has no wheezes. She has no rales.  Abdominal: Soft. Bowel sounds are normal. She exhibits no distension and no mass. There is no tenderness. There is no rebound and no guarding.  Musculoskeletal: She exhibits no edema.  Skin: Skin is warm and dry. No rash noted.  Psychiatric: She has a normal mood and affect.  Nursing note and vitals reviewed.     Assessment & Plan:   Problem List Items Addressed This Visit    Family circumstance    Limited social support - son and daughter live outside of state.  Now has Ileene Rubens and Sharlene Mccluskey coming to house 24 hours a day for supervision, care.  Pt has done much better with med compliance. Will continue to monitor for now. rtc 3 mo f/u visit.  History of CVA (cerebrovascular accident)    Continue aspirin daily.      RESOLVED: Hypertension    Chronic, stable off antihypertensives. Will remove from problem list.      Memory deficit    Continue aricept 5mg  daily.      Seizures - Primary    Seems more consistent with med and subsequently no recent seizure activity. Encouraged continued f/u with neurology Dr Manuella Ghazi.      Relevant Medications   levETIRAcetam (KEPPRA) 500 MG tablet       Follow up plan: Return in about 3 months (around 04/20/2015), or as needed, for follow up visit.

## 2015-01-18 NOTE — Assessment & Plan Note (Signed)
Limited social support - son and daughter live outside of state.  Now has Hannah Howell and [name] coming to house 24 hours a day for supervision, care.  Pt has done much better with med compliance. Will continue to monitor for now. rtc 3 mo f/u visit.

## 2015-01-18 NOTE — Assessment & Plan Note (Signed)
Continue aricept 5mg  daily.

## 2015-01-18 NOTE — Progress Notes (Signed)
Pre visit review using our clinic review tool, if applicable. No additional management support is needed unless otherwise documented below in the visit note. 

## 2015-01-22 DIAGNOSIS — Z8673 Personal history of transient ischemic attack (TIA), and cerebral infarction without residual deficits: Secondary | ICD-10-CM | POA: Diagnosis not present

## 2015-01-22 DIAGNOSIS — F0390 Unspecified dementia without behavioral disturbance: Secondary | ICD-10-CM | POA: Diagnosis not present

## 2015-01-22 DIAGNOSIS — I10 Essential (primary) hypertension: Secondary | ICD-10-CM | POA: Diagnosis not present

## 2015-01-22 DIAGNOSIS — Z87891 Personal history of nicotine dependence: Secondary | ICD-10-CM | POA: Diagnosis not present

## 2015-01-22 DIAGNOSIS — G40909 Epilepsy, unspecified, not intractable, without status epilepticus: Secondary | ICD-10-CM | POA: Diagnosis not present

## 2015-01-22 DIAGNOSIS — G309 Alzheimer's disease, unspecified: Secondary | ICD-10-CM | POA: Diagnosis not present

## 2015-04-23 ENCOUNTER — Telehealth: Payer: Self-pay | Admitting: Family Medicine

## 2015-04-23 ENCOUNTER — Ambulatory Visit (INDEPENDENT_AMBULATORY_CARE_PROVIDER_SITE_OTHER): Payer: Medicare Other | Admitting: Family Medicine

## 2015-04-23 ENCOUNTER — Encounter: Payer: Self-pay | Admitting: Family Medicine

## 2015-04-23 VITALS — BP 158/100 | HR 100 | Temp 98.0°F | Wt 162.5 lb

## 2015-04-23 DIAGNOSIS — R569 Unspecified convulsions: Secondary | ICD-10-CM

## 2015-04-23 DIAGNOSIS — Z23 Encounter for immunization: Secondary | ICD-10-CM | POA: Diagnosis not present

## 2015-04-23 DIAGNOSIS — H04201 Unspecified epiphora, right lacrimal gland: Secondary | ICD-10-CM

## 2015-04-23 DIAGNOSIS — F0390 Unspecified dementia without behavioral disturbance: Secondary | ICD-10-CM

## 2015-04-23 DIAGNOSIS — Z8673 Personal history of transient ischemic attack (TIA), and cerebral infarction without residual deficits: Secondary | ICD-10-CM | POA: Diagnosis not present

## 2015-04-23 DIAGNOSIS — E785 Hyperlipidemia, unspecified: Secondary | ICD-10-CM

## 2015-04-23 DIAGNOSIS — H04209 Unspecified epiphora, unspecified lacrimal gland: Secondary | ICD-10-CM | POA: Insufficient documentation

## 2015-04-23 MED ORDER — OLOPATADINE HCL 0.1 % OP SOLN: 1.0000 [drp] | Freq: Every day | OPHTHALMIC | Status: DC

## 2015-04-23 NOTE — Progress Notes (Signed)
BP 158/100 mmHg  Pulse 100  Temp(Src) 98 F (36.7 C) (Oral)  Wt 162 lb 8 oz (73.71 kg)   CC: 7mo f/u visit  Subjective:    Patient ID: Hannah Howell, female    DOB: [birth year], 74 y.o.   MRN: [id number]  HPI: Hannah Howell is a 74 y.o. female presenting on 04/23/2015 for Follow-up   See prior notes for details. Here with Blanch Media caregiver.   Has caregiver Ileene Rubens who comes out to the house every day 7am-9pm. Arnoldo Morale also helps with meds/meals (9pm-7am). Doyce Loose also comes out to house several times a week. Pt adamant about staying at home. With this set up seemingly doing much better - more compliant with meds, no recent seizure activity or confusion. Family ok with this set up as well (son in [place], daughter in [place]).   Eyes and nose stay runny despite claritin. Feels right eye gets darker than left. No significant itching of eye. + tearing on right.  BP Readings from Last 3 Encounters:  04/23/15 158/100  01/18/15 136/84  11/19/14 130/80    Hard of hearing noted over last few months.  Noticing increasing confusion/memory trouble recently. Has appt with Dr Manuella Ghazi for next month.   Relevant past medical, surgical, family and social history reviewed and updated as indicated. Interim medical history since our last visit reviewed. Allergies and medications reviewed and updated. Current Outpatient Prescriptions on File Prior to Visit  Medication Sig  . aspirin 81 MG tablet Take 81 mg by mouth daily.  . Calcium Carb-Cholecalciferol (CALCIUM-VITAMIN D) 600-400 MG-UNIT TABS Take 1 tablet by mouth 2 (two) times daily.  Marland Kitchen donepezil (ARICEPT) 5 MG tablet Take 5 mg by mouth at bedtime.   . levETIRAcetam (KEPPRA) 500 MG tablet Take 500 mg by mouth 2 (two) times daily.  . QUEtiapine (SEROQUEL) 50 MG tablet Take 50 mg by mouth at bedtime.  . vitamin B-12 (CYANOCOBALAMIN) 1000 MCG tablet Take 1,000 mcg by mouth daily.  . cholecalciferol (VITAMIN D) 1000 UNITS tablet  Take 1,000 Units by mouth daily.  . fish oil-omega-3 fatty acids 1000 MG capsule Take 1 g by mouth daily.   No current facility-administered medications on file prior to visit.    Review of Systems Per HPI unless specifically indicated above     Objective:    BP 158/100 mmHg  Pulse 100  Temp(Src) 98 F (36.7 C) (Oral)  Wt 162 lb 8 oz (73.71 kg)  Wt Readings from Last 3 Encounters:  04/23/15 162 lb 8 oz (73.71 kg)  01/18/15 161 lb 8 oz (73.256 kg)  11/19/14 159 lb 12.8 oz (72.485 kg)    Physical Exam  Constitutional: She appears well-developed and well-nourished. No distress.  HENT:  Mouth/Throat: Oropharynx is clear and moist. No oropharyngeal exudate.  Eyes: Conjunctivae, EOM and lids are normal. Pupils are equal, round, and reactive to light. Right eye exhibits no discharge. Left eye exhibits no discharge. Right conjunctiva is not injected. Left conjunctiva is not injected. No scleral icterus.  Mild hyperpigmentation R periorbital region  Neck: Normal range of motion. Neck supple.  Cardiovascular: Normal rate, regular rhythm, normal heart sounds and intact distal pulses.   No murmur heard. Pulmonary/Chest: Effort normal and breath sounds normal. No respiratory distress. She has no wheezes. She has no rales.  Musculoskeletal: She exhibits no edema.  Lymphadenopathy:    She has no cervical adenopathy.  Skin: Skin is warm and dry. No rash noted.  Psychiatric: She  has a normal mood and affect.  Some trouble understanding questions  Nursing note and vitals reviewed.  Results for orders placed or performed in visit on 11/19/14  Urine Culture  Result Value Ref Range   Colony Count 80,000 COLONIES/ML    Organism ID, Bacteria Multiple bacterial morphotypes present, none    Organism ID, Bacteria predominant. Suggest appropriate recollection if     Organism ID, Bacteria clinically indicated.   Lipid panel  Result Value Ref Range   Cholesterol 219 (H) 0 - 200 mg/dL    Triglycerides 224.0 (H) 0.0 - 149.0 mg/dL   HDL 36.00 (L) >39.00 mg/dL   VLDL 44.8 (H) 0.0 - 40.0 mg/dL   Total CHOL/HDL Ratio 6    NonHDL 183.00   Comprehensive metabolic panel  Result Value Ref Range   Sodium 141 135 - 145 mEq/L   Potassium 4.3 3.5 - 5.1 mEq/L   Chloride 104 96 - 112 mEq/L   CO2 30 19 - 32 mEq/L   Glucose, Bld 79 70 - 99 mg/dL   BUN 28 (H) 6 - 23 mg/dL   Creatinine, Ser 1.10 0.40 - 1.20 mg/dL   Total Bilirubin 0.4 0.2 - 1.2 mg/dL   Alkaline Phosphatase 76 39 - 117 U/L   AST 27 0 - 37 U/L   ALT 14 0 - 35 U/L   Total Protein 7.3 6.0 - 8.3 g/dL   Albumin 4.3 3.5 - 5.2 g/dL   Calcium 9.8 8.4 - 10.5 mg/dL   GFR 51.47 (L) >60.00 mL/min  TSH  Result Value Ref Range   TSH 1.61 0.35 - 4.50 uIU/mL  CBC with Differential/Platelet  Result Value Ref Range   WBC 10.4 4.0 - 10.5 K/uL   RBC 4.28 3.87 - 5.11 Mil/uL   Hemoglobin 14.0 12.0 - 15.0 g/dL   HCT 41.8 36.0 - 46.0 %   MCV 97.9 78.0 - 100.0 fl   MCHC 33.4 30.0 - 36.0 g/dL   RDW 14.0 11.5 - 15.5 %   Platelets 370.0 150.0 - 400.0 K/uL   Neutrophils Relative % 41.1 (L) 43.0 - 77.0 %   Lymphocytes Relative 46.9 (H) 12.0 - 46.0 %   Monocytes Relative 7.8 3.0 - 12.0 %   Eosinophils Relative 3.9 0.0 - 5.0 %   Basophils Relative 0.3 0.0 - 3.0 %   Neutro Abs 4.3 1.4 - 7.7 K/uL   Lymphs Abs 4.9 (H) 0.7 - 4.0 K/uL   Monocytes Absolute 0.8 0.1 - 1.0 K/uL   Eosinophils Absolute 0.4 0.0 - 0.7 K/uL   Basophils Absolute 0.0 0.0 - 0.1 K/uL  Vitamin B12  Result Value Ref Range   Vitamin B-12 1385 (H) 211 - 911 pg/mL  Valproic acid level  Result Value Ref Range   Valproic Acid Lvl 61.2 50.0 - 100.0 ug/mL  LDL cholesterol, direct  Result Value Ref Range   Direct LDL 133.0 mg/dL  POCT Urinalysis Dipstick  Result Value Ref Range   Color, UA yellow    Clarity, UA clear    Glucose, UA neg    Bilirubin, UA neg    Ketones, UA neg    Spec Grav, UA 1.025    Blood, UA trace    pH, UA 6.0    Protein, UA trace     Urobilinogen, UA 0.2    Nitrite, UA neg    Leukocytes, UA large (3+)       Assessment & Plan:   Problem List Items Addressed This Visit  Seizures (HCC) - Primary    Seems stable, sxs free since caregivers involved. Has f/u with neurology next month.      Relevant Medications   divalproex (DEPAKOTE) 250 MG DR tablet   History of CVA (cerebrovascular accident)    Confirmed she takes aspirin 81mg  daily.      Eye tearing    Predominantly right eye - discussed trial patanol. No blocked tear duct noted.      Dyslipidemia    Reviewed fatty fish intake to improve triglycerides vs starting fish oil daily.      Dementia    Caregiver notices more trouble with memory loss and confusion and comprehension. Continue aricept per neurology - has f/u for next month.      Relevant Medications   divalproex (DEPAKOTE) 250 MG DR tablet    Other Visit Diagnoses    Need for influenza vaccination        Relevant Orders    Flu Vaccine QUAD 36+ mos PF IM (Fluarix & Fluzone Quad PF) (Completed)        Follow up plan: Return in about 6 months (around 10/22/2015), or as needed, for annual exam, prior fasting for blood work.

## 2015-04-23 NOTE — Assessment & Plan Note (Signed)
Caregiver notices more trouble with memory loss and confusion and comprehension. Continue aricept per neurology - has f/u for next month.

## 2015-04-23 NOTE — Progress Notes (Signed)
Pre visit review using our clinic review tool, if applicable. No additional management support is needed unless otherwise documented below in the visit note. 

## 2015-04-23 NOTE — Telephone Encounter (Signed)
Would try OTC antihistamine eye drop - check with pharmacist.

## 2015-04-23 NOTE — Assessment & Plan Note (Addendum)
Reviewed fatty fish intake to improve triglycerides vs starting fish oil daily.

## 2015-04-23 NOTE — Patient Instructions (Addendum)
Flu shot today.  Check on fish oil.  Start monitoring blood pressure at local pharmacy and let me know if consistently >150/90.  Try allergy eye drops for right eye. Keep appointment with Dr Manuella Ghazi next week.  Return in 3-4 months for medicare wellness visit.

## 2015-04-23 NOTE — Assessment & Plan Note (Signed)
Confirmed she takes aspirin 81mg  daily.

## 2015-04-23 NOTE — Assessment & Plan Note (Signed)
Predominantly right eye - discussed trial patanol. No blocked tear duct noted.

## 2015-04-23 NOTE — Telephone Encounter (Signed)
Eye medication was $314.00. Is there anything else that is cheaper that she can use?  [place]  Please call Justine Dines back with answer.

## 2015-04-23 NOTE — Assessment & Plan Note (Signed)
Seems stable, sxs free since caregivers involved. Has f/u with neurology next month.

## 2015-04-24 NOTE — Telephone Encounter (Signed)
Brendi notified.

## 2015-04-27 ENCOUNTER — Encounter: Payer: Self-pay | Admitting: Family Medicine

## 2015-05-06 ENCOUNTER — Telehealth: Payer: Self-pay | Admitting: Family Medicine

## 2015-05-06 MED ORDER — AMLODIPINE BESYLATE 5 MG PO TABS: 5.0000 mg | ORAL_TABLET | Freq: Every day | ORAL | Status: DC

## 2015-05-06 NOTE — Telephone Encounter (Signed)
04/29/15 BP was 170/90 sitting and 160/100 standing and then last night it was 168/90 sitting and 168/84 standing. She has no complaint of HA, dizziness, weakness or blurred vision. Caregiver said all patient is wanting to eat is hotdogs. She will leave everything else the refrigerator until it goes bad and go without if the hotdogs aren't there. She is concerned that this is what's running her BP up, but she doesn't know what else to do. She is asking if maybe a med is in order since she has to eat.

## 2015-05-06 NOTE — Telephone Encounter (Signed)
Pt care giver Conchita Truxillo  is calling to find out if pt has had flu shot and also to let us know that her blood pressure is running high.  He Blood pressure has been running at  170/90 sitting, 160/100 standing, 168/90sitting 168/84 standing  cb number [phone] - caretaker will be there for next hour or so

## 2015-05-06 NOTE — Telephone Encounter (Signed)
Let's start amlodipine 5mg  daily - watch for ankle swelling with this medication and let me know if this happens. Sent to [facility].

## 2015-05-07 NOTE — Telephone Encounter (Signed)
Ms. Laurance Flatten notified and verbalized understanding.

## 2015-05-22 DIAGNOSIS — G40909 Epilepsy, unspecified, not intractable, without status epilepticus: Secondary | ICD-10-CM | POA: Diagnosis not present

## 2015-06-14 ENCOUNTER — Ambulatory Visit: Payer: Medicare Other | Admitting: Family Medicine

## 2015-07-19 ENCOUNTER — Encounter: Payer: Self-pay | Admitting: Family Medicine

## 2015-07-19 ENCOUNTER — Ambulatory Visit (INDEPENDENT_AMBULATORY_CARE_PROVIDER_SITE_OTHER)
Admission: RE | Admit: 2015-07-19 | Discharge: 2015-07-19 | Disposition: A | Discharge status: Home / Self Care | Payer: Medicare Other | Source: Ambulatory Visit | Attending: Family Medicine | Admitting: Family Medicine

## 2015-07-19 ENCOUNTER — Ambulatory Visit (INDEPENDENT_AMBULATORY_CARE_PROVIDER_SITE_OTHER): Payer: Medicare Other | Admitting: Family Medicine

## 2015-07-19 VITALS — BP 140/86 | HR 92 | Temp 98.3°F | Wt 169.0 lb

## 2015-07-19 DIAGNOSIS — S99922A Unspecified injury of left foot, initial encounter: Secondary | ICD-10-CM | POA: Diagnosis not present

## 2015-07-19 DIAGNOSIS — I10 Essential (primary) hypertension: Secondary | ICD-10-CM | POA: Diagnosis not present

## 2015-07-19 DIAGNOSIS — G309 Alzheimer's disease, unspecified: Secondary | ICD-10-CM

## 2015-07-19 DIAGNOSIS — F0150 Vascular dementia without behavioral disturbance: Secondary | ICD-10-CM

## 2015-07-19 DIAGNOSIS — M79672 Pain in left foot: Secondary | ICD-10-CM | POA: Diagnosis not present

## 2015-07-19 DIAGNOSIS — R569 Unspecified convulsions: Secondary | ICD-10-CM | POA: Diagnosis not present

## 2015-07-19 DIAGNOSIS — F0280 Dementia in other diseases classified elsewhere without behavioral disturbance: Secondary | ICD-10-CM

## 2015-07-19 DIAGNOSIS — S99929A Unspecified injury of unspecified foot, initial encounter: Secondary | ICD-10-CM | POA: Insufficient documentation

## 2015-07-19 NOTE — Progress Notes (Signed)
BP 140/86 mmHg  Pulse 92  Temp(Src) 98.3 F (36.8 C) (Oral)  Wt 169 lb (76.658 kg)   CC: f/u visit  Subjective:    Patient ID: Hannah Howell, female    DOB: Dec 10, 1940, 75 y.o.   MRN: [id number]  HPI: ALEASA DRAUGHN is a 75 y.o. female presenting on 07/19/2015 for Follow-up and Foot Pain   See prior notes for details. Here with Blanch Media caregiver.   Has caregiver Ileene Rubens who comes out to the house every day 7am-9pm. Arnoldo Morale also helps with meds/meals (9pm-7am). Doyce Loose also comes out to house several times a week. Pt adamant about staying at home. With this set up seemingly doing much better - more compliant with meds, no recent seizure activity or confusion. Family ok with this set up as well (son in [place], daughter in [place]).   Noticing increasing confusion/memory trouble recently. Has appt with Dr Manuella Ghazi for next month.   HTN - No HA, vision changes, CP/tightness, SOB. + L>R leg swelling.   Now with L foot pain that started after twisting left foot while getting out of bed at the getting out of this week. Has been treating with tiger balm and tylenol.   Relevant past medical, surgical, family and social history reviewed and updated as indicated. Interim medical history since our last visit reviewed. Allergies and medications reviewed and updated. Current Outpatient Prescriptions on File Prior to Visit  Medication Sig  . amLODipine (NORVASC) 5 MG tablet Take 1 tablet (5 mg total) by mouth daily.  Marland Kitchen aspirin 81 MG tablet Take 81 mg by mouth daily.  . Calcium Carb-Cholecalciferol (CALCIUM-VITAMIN D) 600-400 MG-UNIT TABS Take 1 tablet by mouth 2 (two) times daily.  . cholecalciferol (VITAMIN D) 1000 UNITS tablet Take 1,000 Units by mouth daily.  . divalproex (DEPAKOTE) 250 MG DR tablet Take 250 mg by mouth 2 (two) times daily.  Marland Kitchen donepezil (ARICEPT) 5 MG tablet Take 5 mg by mouth at bedtime.   . fish oil-omega-3 fatty acids 1000 MG capsule Take 1 g by mouth daily.   Marland Kitchen levETIRAcetam (KEPPRA) 500 MG tablet Take 500 mg by mouth 2 (two) times daily.  Marland Kitchen loratadine (CLARITIN) 10 MG tablet Take 10 mg by mouth at bedtime.  Marland Kitchen QUEtiapine (SEROQUEL) 50 MG tablet Take 50 mg by mouth at bedtime.  . vitamin B-12 (CYANOCOBALAMIN) 1000 MCG tablet Take 1,000 mcg by mouth daily.  . Zinc 50 MG TABS Take 1 tablet by mouth daily.   No current facility-administered medications on file prior to visit.    Review of Systems Per HPI unless specifically indicated in ROS section     Objective:    BP 140/86 mmHg  Pulse 92  Temp(Src) 98.3 F (36.8 C) (Oral)  Wt 169 lb (76.658 kg)  Wt Readings from Last 3 Encounters:  07/19/15 169 lb (76.658 kg)  04/23/15 162 lb 8 oz (73.71 kg)  01/18/15 161 lb 8 oz (73.256 kg)    Physical Exam  Constitutional: She appears well-developed and well-nourished. No distress.  HENT:  Mouth/Throat: Oropharynx is clear and moist. No oropharyngeal exudate.  Cardiovascular: Normal rate, regular rhythm, normal heart sounds and intact distal pulses.   No murmur heard. Pulmonary/Chest: Effort normal and breath sounds normal. No respiratory distress. She has no wheezes. She has no rales.  Musculoskeletal: She exhibits edema.  R foot mild edema without pain L foot tender to palpation at 4th MT shaft and lateral malleolus without ligament laxity No pain  at base of 5th MT or with axial loading at digits 2+ DP bilaterally  Skin: Skin is warm and dry. Bruising noted. No rash noted.  Nursing note and vitals reviewed.  LEFT FOOT - COMPLETE 3+ VIEW COMPARISON: None. FINDINGS: There is no evidence of fracture or dislocation. There is no evidence of arthropathy or other focal bone abnormality. Soft tissues are unremarkable. IMPRESSION: No significant abnormality seen in the left foot. Electronically Signed  By: Marijo Conception, M.D.  On: 07/19/2015 16:06    Assessment & Plan:   Problem List Items Addressed This Visit    Seizures ([place])     Regular with depakote and keppra. No recurrent seizures. Sees neuro.      Mixed Alzheimer's and vascular dementia    I asked her to sign release for latest note from neurologist.  Has caregiver with her 24/7 who assists some with medications, receives pill packet from pharmacy.       Hypertension    Chronic, stable. Continue current regimen.       Foot injury - Primary    Concern for MT injury. Check xray today - clear on my read without fracture or dislocation. Anticipate bony contusion. Will place in post -op shoe x2 wks and recommended elevation, rest, tylenol and ice to foot (discussed safe ice use). Update if persistent sxs despite treatment.      Relevant Orders   DG Foot Complete Left (Completed)       Follow up plan: Return in about 3 months (around 10/17/2015), or as needed, for follow up visit.

## 2015-07-19 NOTE — Assessment & Plan Note (Addendum)
Concern for MT injury. Check xray today - clear on my read without fracture or dislocation. Anticipate bony contusion. Will place in post -op shoe x2 wks and recommended elevation, rest, tylenol and ice to foot (discussed safe ice use). Update if persistent sxs despite treatment.

## 2015-07-19 NOTE — Progress Notes (Signed)
Pre visit review using our clinic review tool, if applicable. No additional management support is needed unless otherwise documented below in the visit note. 

## 2015-07-19 NOTE — Patient Instructions (Addendum)
Sign release for last office visit from Dr Manuella Ghazi as I haven't received yet.  Call me with name of eye drops.  Blood pressure is looking great! Xray of left foot.

## 2015-07-19 NOTE — Assessment & Plan Note (Addendum)
Regular with depakote and keppra. No recurrent seizures. Sees neuro.

## 2015-07-19 NOTE — Assessment & Plan Note (Signed)
Chronic, stable. Continue current regimen. 

## 2015-07-19 NOTE — Assessment & Plan Note (Addendum)
I asked her to sign release for latest note from neurologist.  Has caregiver with her 24/7 who assists some with medications, receives pill packet from pharmacy.

## 2015-07-22 ENCOUNTER — Telehealth: Payer: Self-pay | Admitting: Family Medicine

## 2015-07-22 NOTE — Telephone Encounter (Signed)
shontrell fedorov called to let you know what she is giving ms Bertini  berkley jensen allergy releif dithenhydramine hydrochloride antihistime 2 tablets daily   25 mg  naphcona eye drop Twice daily 2 in right eye 1 in left eye  Ms Laurance Flatten would like a call back [phone]

## 2015-07-22 NOTE — Telephone Encounter (Signed)
Spoke with Mrs. Laurance Flatten and med list updated.

## 2015-07-25 ENCOUNTER — Ambulatory Visit: Payer: Medicare Other | Admitting: Family Medicine

## 2015-08-08 ENCOUNTER — Telehealth: Payer: Self-pay | Admitting: Family Medicine

## 2015-08-08 NOTE — Telephone Encounter (Signed)
Care giver called in - pt's foot is still swollen, and when caregiver put ice on it, it did not feel cool to the touch.  Care giver does not know if she needs another  appt or not  Please call [phone] Thank you

## 2015-08-08 NOTE — Telephone Encounter (Signed)
Spoke with Hannah Howell. She said the bruising and pain have gone but the swelling is still present. The foot feels normal temp. Patient is refusing to wear post-op shoe but will elevate it. Should they come back in or just give it some more time?

## 2015-08-09 NOTE — Telephone Encounter (Signed)
Its been about 3 wks.  Would give more time as long as not tender. Any swelling of right foot?

## 2015-08-09 NOTE — Telephone Encounter (Signed)
Ms. Hannah Howell notified and verbalized understanding. She said there was no swelling to her right foot.

## 2015-09-09 ENCOUNTER — Ambulatory Visit (INDEPENDENT_AMBULATORY_CARE_PROVIDER_SITE_OTHER): Payer: Medicare Other | Admitting: Family Medicine

## 2015-09-09 ENCOUNTER — Encounter: Payer: Self-pay | Admitting: Family Medicine

## 2015-09-09 VITALS — BP 118/82 | HR 100 | Temp 98.7°F | Wt 172.5 lb

## 2015-09-09 DIAGNOSIS — J029 Acute pharyngitis, unspecified: Secondary | ICD-10-CM | POA: Insufficient documentation

## 2015-09-09 DIAGNOSIS — R600 Localized edema: Secondary | ICD-10-CM | POA: Insufficient documentation

## 2015-09-09 DIAGNOSIS — I10 Essential (primary) hypertension: Secondary | ICD-10-CM | POA: Diagnosis not present

## 2015-09-09 MED ORDER — LORATADINE 10 MG PO TABS: 10.0000 mg | ORAL_TABLET | Freq: Every day | ORAL | Status: DC

## 2015-09-09 NOTE — Assessment & Plan Note (Signed)
Anticipate amlodipine related - reassess next visit. Not painful.

## 2015-09-09 NOTE — Progress Notes (Signed)
BP 118/82 mmHg  Pulse 100  Temp(Src) 98.7 F (37.1 C) (Oral)  Wt 172 lb 8 oz (78.245 kg)  SpO2 96%   CC: cough  Subjective:    Patient ID: Hannah Howell, female    DOB: 1941/04/19, 75 y.o.   MRN: [id number]  HPI: HALLEL MELLAND is a 75 y.o. female presenting on 09/09/2015 for Cough   Productive cough of phlegm, hoarse voice and sore throat. This started over last few days. +rhinorrhea.   No fevers/chills, ear or tooth pain, headache, no PNdrainage. No significant congestion.   No sick contacts at home.  Not around smokers.  Hasn't tried anything for this other than benadryl for allergies. Also taking naphcon OP for eyes  Persistent L leg swelling at foot. Evaluated last visit with normal xrays. She was not too regular with post op shoe. BP Readings from Last 3 Encounters:  09/09/15 118/82  07/19/15 140/86  04/23/15 158/100    Relevant past medical, surgical, family and social history reviewed and updated as indicated. Interim medical history since our last visit reviewed. Allergies and medications reviewed and updated. Current Outpatient Prescriptions on File Prior to Visit  Medication Sig  . amLODipine (NORVASC) 5 MG tablet Take 1 tablet (5 mg total) by mouth daily.  Marland Kitchen aspirin 81 MG tablet Take 81 mg by mouth daily.  . Calcium Carb-Cholecalciferol (CALCIUM-VITAMIN D) 600-400 MG-UNIT TABS Take 1 tablet by mouth 2 (two) times daily.  . cholecalciferol (VITAMIN D) 1000 UNITS tablet Take 1,000 Units by mouth daily.  . diphenhydrAMINE (BENADRYL) 25 MG tablet Take 25 mg by mouth 2 (two) times daily.  . divalproex (DEPAKOTE) 250 MG DR tablet Take 250 mg by mouth 2 (two) times daily.  Marland Kitchen donepezil (ARICEPT) 5 MG tablet Take 5 mg by mouth at bedtime.   . fish oil-omega-3 fatty acids 1000 MG capsule Take 1 g by mouth daily.  Marland Kitchen levETIRAcetam (KEPPRA) 500 MG tablet Take 500 mg by mouth 2 (two) times daily.  . Naphazoline HCl (NAPHCON OP) Apply to eye as directed. 2 drops in right  eye and 1 drop in left eye twice daily  . QUEtiapine (SEROQUEL) 50 MG tablet Take 50 mg by mouth at bedtime.  . vitamin B-12 (CYANOCOBALAMIN) 1000 MCG tablet Take 1,000 mcg by mouth daily.  . Zinc 50 MG TABS Take 1 tablet by mouth daily.   No current facility-administered medications on file prior to visit.    Review of Systems Per HPI unless specifically indicated in ROS section     Objective:    BP 118/82 mmHg  Pulse 100  Temp(Src) 98.7 F (37.1 C) (Oral)  Wt 172 lb 8 oz (78.245 kg)  SpO2 96%  Wt Readings from Last 3 Encounters:  09/09/15 172 lb 8 oz (78.245 kg)  07/19/15 169 lb (76.658 kg)  04/23/15 162 lb 8 oz (73.71 kg)    Physical Exam  Constitutional: She appears well-developed and well-nourished. No distress.  HENT:  Head: Normocephalic and atraumatic.  Right Ear: Hearing, tympanic membrane, external ear and ear canal normal.  Left Ear: Hearing, tympanic membrane, external ear and ear canal normal.  Nose: Mucosal edema (mild) and rhinorrhea present.  Mouth/Throat: Uvula is midline and mucous membranes are normal. Posterior oropharyngeal erythema (mild) present. No oropharyngeal exudate, posterior oropharyngeal edema or tonsillar abscesses.  Eyes: Conjunctivae and EOM are normal. Pupils are equal, round, and reactive to light. No scleral icterus.  Neck: Normal range of motion. Neck supple.  Cardiovascular: Normal  rate, regular rhythm, normal heart sounds and intact distal pulses.   No murmur heard. Pulmonary/Chest: Effort normal and breath sounds normal. No respiratory distress. She has no wheezes. She has no rales.  Musculoskeletal: She exhibits edema (pedal bilaterally).  Lymphadenopathy:    She has no cervical adenopathy.  Skin: Skin is warm and dry. No rash noted.  Nursing note and vitals reviewed.     Assessment & Plan:   Problem List Items Addressed This Visit    Sore throat - Primary    Anticipate allergic. Treat with claritin daily, benadryl nightly.  Update with treatment effect.      Pedal edema    Anticipate amlodipine related - reassess next visit. Not painful.       Hypertension    bp great today.           Follow up plan: Return in about 3 months (around 12/10/2015), or as needed, for medicare wellness visit.

## 2015-09-09 NOTE — Assessment & Plan Note (Signed)
Anticipate allergic. Treat with claritin daily, benadryl nightly. Update with treatment effect.

## 2015-09-09 NOTE — Assessment & Plan Note (Signed)
bp great today.

## 2015-09-09 NOTE — Progress Notes (Signed)
Pre visit review using our clinic review tool, if applicable. No additional management support is needed unless otherwise documented below in the visit note. 

## 2015-09-09 NOTE — Patient Instructions (Addendum)
I think sore throat is from allergies - make sure you're taking one claritin (loratadine) daily in the morning and ok to take benadryl at night time. Let us know if not better with this.  Try crystal light or other flavored water supplements.  Return in 3 months for medicare wellness visit.

## 2015-10-11 ENCOUNTER — Telehealth: Payer: Self-pay | Admitting: Family Medicine

## 2015-10-11 NOTE — Telephone Encounter (Signed)
Not needed. Care giver notified and appt cancelled.

## 2015-10-11 NOTE — Telephone Encounter (Signed)
Care giver want to make sure that pt does not need to come in on the 11th, they were told that when tey saw Dr G last time, but want to make sure.  cb number is [phone] Thanks

## 2015-10-14 ENCOUNTER — Emergency Department: Payer: Medicare Other

## 2015-10-14 ENCOUNTER — Emergency Department
Admission: EM | Admit: 2015-10-14 | Discharge: 2015-10-14 | Disposition: A | Discharge status: Home / Self Care | Payer: Medicare Other | Attending: Emergency Medicine | Admitting: Emergency Medicine

## 2015-10-14 DIAGNOSIS — M7989 Other specified soft tissue disorders: Secondary | ICD-10-CM | POA: Diagnosis present

## 2015-10-14 DIAGNOSIS — Z87891 Personal history of nicotine dependence: Secondary | ICD-10-CM | POA: Insufficient documentation

## 2015-10-14 DIAGNOSIS — I10 Essential (primary) hypertension: Secondary | ICD-10-CM | POA: Diagnosis not present

## 2015-10-14 DIAGNOSIS — G40909 Epilepsy, unspecified, not intractable, without status epilepticus: Secondary | ICD-10-CM | POA: Diagnosis not present

## 2015-10-14 DIAGNOSIS — R4182 Altered mental status, unspecified: Secondary | ICD-10-CM | POA: Diagnosis not present

## 2015-10-14 DIAGNOSIS — N390 Urinary tract infection, site not specified: Secondary | ICD-10-CM | POA: Insufficient documentation

## 2015-10-14 LAB — CBC WITH DIFFERENTIAL/PLATELET
BASOS PCT: 1 %
Basophils Absolute: 0.1 10*3/uL (ref 0–0.1)
EOS ABS: 0.2 10*3/uL (ref 0–0.7)
Eosinophils Relative: 1 %
HEMATOCRIT: 39.7 % (ref 35.0–47.0)
HEMOGLOBIN: 13.5 g/dL (ref 12.0–16.0)
Lymphocytes Relative: 25 %
Lymphs Abs: 3.4 10*3/uL (ref 1.0–3.6)
MCH: 33 pg (ref 26.0–34.0)
MCHC: 34.1 g/dL (ref 32.0–36.0)
MCV: 96.8 fL (ref 80.0–100.0)
Monocytes Absolute: 1 10*3/uL — ABNORMAL HIGH (ref 0.2–0.9)
Monocytes Relative: 8 %
NEUTROS ABS: 9 10*3/uL — AB (ref 1.4–6.5)
NEUTROS PCT: 65 %
Platelets: 334 10*3/uL (ref 150–440)
RBC: 4.1 MIL/uL (ref 3.80–5.20)
RDW: 13.6 % (ref 11.5–14.5)
WBC: 13.7 10*3/uL — AB (ref 3.6–11.0)

## 2015-10-14 LAB — URINALYSIS COMPLETE WITH MICROSCOPIC (ARMC ONLY)
BILIRUBIN URINE: NEGATIVE
GLUCOSE, UA: NEGATIVE mg/dL
Nitrite: NEGATIVE
Protein, ur: 100 mg/dL — AB
SPECIFIC GRAVITY, URINE: 1.017 (ref 1.005–1.030)
pH: 7 (ref 5.0–8.0)

## 2015-10-14 LAB — BASIC METABOLIC PANEL
Anion gap: 8 (ref 5–15)
BUN: 18 mg/dL (ref 6–20)
CHLORIDE: 106 mmol/L (ref 101–111)
CO2: 26 mmol/L (ref 22–32)
Calcium: 9.4 mg/dL (ref 8.9–10.3)
Creatinine, Ser: 1.12 mg/dL — ABNORMAL HIGH (ref 0.44–1.00)
GFR calc non Af Amer: 47 mL/min — ABNORMAL LOW (ref >60)
GFR, EST AFRICAN AMERICAN: 54 mL/min — AB (ref >60)
Glucose, Bld: 131 mg/dL — ABNORMAL HIGH (ref 65–99)
POTASSIUM: 3.9 mmol/L (ref 3.5–5.1)
SODIUM: 140 mmol/L (ref 135–145)

## 2015-10-14 LAB — TROPONIN I

## 2015-10-14 MED ORDER — CEPHALEXIN 500 MG PO CAPS
ORAL_CAPSULE | ORAL | Status: AC
  Filled 2015-10-14: qty 1

## 2015-10-14 MED ORDER — CEPHALEXIN 500 MG PO CAPS
500.0000 mg | ORAL_CAPSULE | Freq: Once | ORAL | Status: AC
  Administered 2015-10-14: 500 mg via ORAL

## 2015-10-14 MED ORDER — CEPHALEXIN 500 MG PO CAPS: 500.0000 mg | ORAL_CAPSULE | Freq: Three times a day (TID) | ORAL | Status: DC

## 2015-10-14 NOTE — ED Notes (Signed)
MD at bedside. 

## 2015-10-14 NOTE — ED Provider Notes (Signed)
[facility] Emergency Department Provider Note    ____________________________________________  Time seen: ~1645  I have reviewed the triage vital signs and the nursing notes.   HISTORY  Chief Complaint Leg Swelling   History limited by: Confusion   HPI Hannah Howell is a 75 y.o. female who presents to the emergency department today via EMS.Per EMS they were called out by neighbors because they saw the patient stumbling on the side of the road. The patient herself is unclear as to why she is here. She does know she came by EMS but is unaware of who called EMS. Her main complaint is for bilateral leg edema. She is able to state that she saw her primary care doctor for this and per chart review there is a note on the 6 mentioning edema. Per the doctor's note however the plan was to simply follow up. The patient stated today that the doctor wanted her to go to the hospital although again this was now 4 days ago. The patient also thought that the doctor thought the patient was having pain in her ankles although the doctor's note states that it is painless. The patient denies any pain in her legs. She denies any chest pain. Denies any shortness of breath. Denies any recent fevers. She does however state that she has had some discomfort with urination.    Past Medical History  Diagnosis Date  . Seizures ([place]) since teen    following head trauma as 75 yo (Dr. Manuella Ghazi, [facility])  . 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 Vitamin D deficiency   . History of stroke     on imaging - lacunar R and L pons and R occipital  . Idiopathic peripheral neuropathy (HCC)   . History of chicken pox   . Hypertension   . Stroke [facility])     Patient Active Problem List   Diagnosis Date Noted  . Sore throat 09/09/2015  . Pedal edema 09/09/2015  . Hypertension   . Dyslipidemia 04/23/2015  . Eye tearing 04/23/2015  . Family circumstance 01/18/2015  . Urinary urgency 11/19/2014   . Medicare annual wellness visit, initial 12/06/2012  . Hematuria 12/06/2012  . History of CVA (cerebrovascular accident) 08/30/2012  . History of lymphoma   . Vitamin D deficiency   . 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 Seizures [facility])     Past Surgical History  Procedure Laterality Date  . 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 Partial gastrectomy  2004    lymphoma  . Eye surgery Bilateral 2014    unclear type  . Colonoscopy  02/2013    mild diverticulosis, WNL, rpt 10 [facility])    Current Outpatient Rx  Name  Route  Sig  Dispense  Refill  . amLODipine (NORVASC) 5 MG tablet   Oral   Take 1 tablet (5 mg total) by mouth daily.   30 tablet   6   . aspirin 81 MG tablet   Oral   Take 81 mg by mouth daily.         . Calcium Carb-Cholecalciferol (CALCIUM-VITAMIN D) 600-400 MG-UNIT TABS   Oral   Take 1 tablet by mouth 2 (two) times daily.         . cholecalciferol (VITAMIN D) 1000 UNITS tablet   Oral   Take 1,000 Units by mouth daily.         . diphenhydrAMINE (BENADRYL) 25 MG tablet  Oral   Take 25 mg by mouth 2 (two) times daily.         . divalproex (DEPAKOTE) 250 MG DR tablet   Oral   Take 250 mg by mouth 2 (two) times daily.         Marland Kitchen donepezil (ARICEPT) 5 MG tablet   Oral   Take 5 mg by mouth at bedtime.          . fish oil-omega-3 fatty acids 1000 MG capsule   Oral   Take 1 g by mouth daily.         Marland Kitchen levETIRAcetam (KEPPRA) 500 MG tablet   Oral   Take 500 mg by mouth 2 (two) times daily.         Marland Kitchen loratadine (CLARITIN) 10 MG tablet   Oral   Take 1 tablet (10 mg total) by mouth daily.   90 tablet   3   . Naphazoline HCl (NAPHCON OP)   Ophthalmic   Apply to eye as directed. 2 drops in right eye and 1 drop in left eye twice daily         . QUEtiapine (SEROQUEL) 50 MG tablet   Oral   Take 50 mg by mouth at bedtime.         . vitamin B-12 (CYANOCOBALAMIN) 1000 MCG tablet   Oral   Take 1,000 mcg by mouth daily.          . Zinc 50 MG TABS   Oral   Take 1 tablet by mouth daily.           Allergies Review of patient's allergies indicates no known allergies.  Family History  Problem Relation Age of Onset  . Diabetes    . Alcohol abuse Father   . CAD Mother     MI  . Cancer - Other Mother     py unsure what kind of cancer mother had    Social History Social History  Substance Use Topics  . Smoking status: Former Research scientist (life sciences)  . Smokeless tobacco: Never Used  . Alcohol Use: No    Review of Systems  Constitutional: Negative for fever. Cardiovascular: Negative for chest pain. Respiratory: Negative for shortness of breath. Gastrointestinal: Negative for abdominal pain, vomiting and diarrhea. Neurological: Negative for headaches, focal weakness or numbness.  10-point ROS otherwise negative.  ____________________________________________   PHYSICAL EXAM:  VITAL SIGNS:   98.1 F (36.7 C)  92  16  131/71 mmHg  95 %    Constitutional: Awake and alert. Not oriented to time or event.  Eyes: Conjunctivae are normal. PERRL. Normal extraocular movements. ENT   Head: Normocephalic and atraumatic.   Nose: No congestion/rhinnorhea.   Mouth/Throat: Mucous membranes are moist.   Neck: No stridor. Hematological/Lymphatic/Immunilogical: No cervical lymphadenopathy. Cardiovascular: Normal rate, regular rhythm.  No murmurs, rubs, or gallops. Respiratory: Normal respiratory effort without tachypnea nor retractions. Breath sounds are clear and equal bilaterally. No wheezes/rales/rhonchi. Gastrointestinal: Soft and nontender. No distention.  Genitourinary: Deferred Musculoskeletal: Normal range of motion in all extremities. No joint effusions.  No lower extremity tenderness nor edema. Neurologic:  States the year is 2007. Unclear why she is in the emergency department. Moves all extremities.  Skin:  Skin is warm, dry and intact. No rash noted. Psychiatric: Mood and affect are normal.  Speech and behavior are normal. Patient exhibits appropriate insight and judgment.  ____________________________________________    LABS (pertinent positives/negatives)  Labs Reviewed  CBC WITH DIFFERENTIAL/PLATELET - Abnormal; Notable for the  following:    WBC 13.7 (*)    Neutro Abs 9.0 (*)    Monocytes Absolute 1.0 (*)    All other components within normal limits  BASIC METABOLIC PANEL - Abnormal; Notable for the following:    Glucose, Bld 131 (*)    Creatinine, Ser 1.12 (*)    GFR calc non Af Amer 47 (*)    GFR calc Af Amer 54 (*)    All other components within normal limits  URINALYSIS COMPLETEWITH MICROSCOPIC ([facility] ONLY) - Abnormal; Notable for the following:    Color, Urine YELLOW (*)    APPearance HAZY (*)    Ketones, ur TRACE (*)    Hgb urine dipstick 1+ (*)    Protein, ur 100 (*)    Leukocytes, UA 1+ (*)    Bacteria, UA RARE (*)    Squamous Epithelial / LPF 0-5 (*)    All other components within normal limits  URINE CULTURE  TROPONIN I     ____________________________________________   EKG  None  ____________________________________________    RADIOLOGY  CT head IMPRESSION: Atrophy with small vessel chronic ischemic changes of deep cerebral white matter.  Old lacunar infarct RIGHT basal ganglia.  No acute intracranial abnormalities.  ____________________________________________   PROCEDURES  Procedure(s) performed: None  Critical Care performed: No  ____________________________________________   INITIAL IMPRESSION / ASSESSMENT AND PLAN / ED COURSE  Pertinent labs & imaging results that were available during my care of the patient were reviewed by me and considered in my medical decision making (see chart for details).  Patient presented to the emergency department today brought in by EMS because of a neighbor's concern for stumbling. On exam patient is somewhat confused. After initial evaluation caregiver did arrive and states that  patient is more or less her baseline. She does have a history of early dementia. Blood work shows a mild leukocytosis and elevation of the creatinine. Urine with elevated white blood cells trace ketones and 1+ leukocytosis concerning for urinary tract infection. Will plan on treating patient with antibiotics. Will discharge home.  ____________________________________________   FINAL CLINICAL IMPRESSION(S) / ED DIAGNOSES  Final diagnoses:  UTI (lower urinary tract infection)     Nance Pear, MD 10/14/15 1920

## 2015-10-14 NOTE — ED Notes (Signed)
Caregiver at bedside. Caregiver sts that pt has beginning stages of dementia and confusion not unusual.

## 2015-10-14 NOTE — ED Notes (Addendum)
Pt bib EMS w/ c/o bilateral leg swelling.  Per EMS, pts neighbors called [employer] b/c pt was walking on side of road w/ stumble.  Pt denies pain or injury to legs/ankles.  Pt oriented to person and situation.  Pt denies SOB, CP or LOC.  Pt does have bilateral leg swelling +1.  Pt sts that her PCP told her to sleep with "one leg over the other". EMS sts that pt lives alone but has home aid. Pt able to move all extremities w/o difficulty

## 2015-10-14 NOTE — Discharge Instructions (Signed)
Please seek medical attention for any high fevers, chest pain, shortness of breath, change in behavior, persistent vomiting, bloody stool or any other new or concerning symptoms.   Urinary Tract Infection A urinary tract infection (UTI) can occur any place along the urinary tract. The tract includes the kidneys, ureters, bladder, and urethra. A type of germ called bacteria often causes a UTI. UTIs are often helped with antibiotic medicine.  HOME CARE   If given, take antibiotics as told by your doctor. Finish them even if you start to feel better.  Drink enough fluids to keep your pee (urine) clear or pale yellow.  Avoid tea, drinks with caffeine, and bubbly (carbonated) drinks.  Pee often. Avoid holding your pee in for a long time.  Pee before and after having sex (intercourse).  Wipe from front to back after you poop (bowel movement) if you are a woman. Use each tissue only once. GET HELP RIGHT AWAY IF:   You have back pain.  You have lower belly (abdominal) pain.  You have chills.  You feel sick to your stomach (nauseous).  You throw up (vomit).  Your burning or discomfort with peeing does not go away.  You have a fever.  Your symptoms are not better in 3 days. MAKE SURE YOU:   Understand these instructions.  Will watch your condition.  Will get help right away if you are not doing well or get worse.   This information is not intended to replace advice given to you by your health care provider. Make sure you discuss any questions you have with your health care provider.   Document Released: 12/09/2007 Document Revised: 07/13/2014 Document Reviewed: 01/21/2012 Elsevier Interactive Patient Education [employer].

## 2015-10-15 ENCOUNTER — Ambulatory Visit: Payer: Medicare Other | Admitting: Family Medicine

## 2015-10-16 LAB — URINE CULTURE

## 2015-10-28 ENCOUNTER — Encounter: Payer: Self-pay | Admitting: Family Medicine

## 2015-10-28 ENCOUNTER — Ambulatory Visit (INDEPENDENT_AMBULATORY_CARE_PROVIDER_SITE_OTHER): Payer: Medicare Other | Admitting: Family Medicine

## 2015-10-28 VITALS — BP 118/78 | HR 100 | Temp 98.2°F | Wt 174.8 lb

## 2015-10-28 DIAGNOSIS — I10 Essential (primary) hypertension: Secondary | ICD-10-CM

## 2015-10-28 DIAGNOSIS — E669 Obesity, unspecified: Secondary | ICD-10-CM | POA: Insufficient documentation

## 2015-10-28 DIAGNOSIS — R600 Localized edema: Secondary | ICD-10-CM

## 2015-10-28 DIAGNOSIS — R410 Disorientation, unspecified: Secondary | ICD-10-CM | POA: Diagnosis not present

## 2015-10-28 DIAGNOSIS — E663 Overweight: Secondary | ICD-10-CM

## 2015-10-28 DIAGNOSIS — F0150 Vascular dementia without behavioral disturbance: Secondary | ICD-10-CM | POA: Diagnosis not present

## 2015-10-28 DIAGNOSIS — G309 Alzheimer's disease, unspecified: Secondary | ICD-10-CM

## 2015-10-28 DIAGNOSIS — F0280 Dementia in other diseases classified elsewhere without behavioral disturbance: Secondary | ICD-10-CM | POA: Diagnosis not present

## 2015-10-28 DIAGNOSIS — E66811 Obesity, class 1: Secondary | ICD-10-CM | POA: Insufficient documentation

## 2015-10-28 DIAGNOSIS — R4182 Altered mental status, unspecified: Secondary | ICD-10-CM | POA: Insufficient documentation

## 2015-10-28 LAB — POC URINALSYSI DIPSTICK (AUTOMATED)
GLUCOSE UA: NEGATIVE
NITRITE UA: NEGATIVE
Urobilinogen, UA: 0.2
pH, UA: 5.5

## 2015-10-28 NOTE — Assessment & Plan Note (Addendum)
Chronic, stable. Great control.

## 2015-10-28 NOTE — Progress Notes (Signed)
BP 118/78 mmHg  Pulse 100  Temp(Src) 98.2 F (36.8 C) (Oral)  Wt 174 lb 12 oz (79.266 kg)   CC: ER f/u visit  Subjective:    Patient ID: Hannah Howell, female    DOB: 07-31-1940, 75 y.o.   MRN: [id number]  HPI: Hannah Howell is a 75 y.o. female presenting on 10/28/2015 for Follow-up   Recent ER visit 10/14/2015 at [facility] for [facility] - found stumbling on side of road, brought to ER via EMS. Known early dementia. This is second time she's walked unsupervised.   Labs at ER remarkable for WBC 13.7, Cr 1.12 and GFR 47, possible UTI (hgb 1+, LE 1+) - treated with keflex 10d course (TID dosing). UCx MBM. Head CT was stable.   Worried about hands because they have white lines on palms and fingers. No pain, swelling.  50lb weight gain over last 3 yrs noted. This was since depakote started.  Breakfast - crackers   Relevant past medical, surgical, family and social history reviewed and updated as indicated. Interim medical history since our last visit reviewed. Allergies and medications reviewed and updated. Current Outpatient Prescriptions on File Prior to Visit  Medication Sig  . amLODipine (NORVASC) 5 MG tablet Take 1 tablet (5 mg total) by mouth daily.  Marland Kitchen aspirin 81 MG tablet Take 81 mg by mouth daily.  . Calcium Carb-Cholecalciferol (CALCIUM-VITAMIN D) 600-400 MG-UNIT TABS Take 1 tablet by mouth 2 (two) times daily.  . cholecalciferol (VITAMIN D) 1000 UNITS tablet Take 1,000 Units by mouth daily.  . divalproex (DEPAKOTE) 250 MG DR tablet Take 250 mg by mouth 2 (two) times daily.  Marland Kitchen donepezil (ARICEPT) 5 MG tablet Take 5 mg by mouth at bedtime.   . fish oil-omega-3 fatty acids 1000 MG capsule Take 1 g by mouth daily.  Marland Kitchen levETIRAcetam (KEPPRA) 500 MG tablet Take 500 mg by mouth 2 (two) times daily.  Marland Kitchen loratadine (CLARITIN) 10 MG tablet Take 1 tablet (10 mg total) by mouth daily. (Patient taking differently: Take 10 mg by mouth at bedtime. )  . Naphazoline HCl (NAPHCON OP) Apply to eye as  directed. 2 drops in right eye and 1 drop in left eye twice daily  . QUEtiapine (SEROQUEL) 50 MG tablet Take 50 mg by mouth at bedtime.  . vitamin B-12 (CYANOCOBALAMIN) 1000 MCG tablet Take 1,000 mcg by mouth daily.  . Zinc 50 MG TABS Take 1 tablet by mouth daily.  . diphenhydrAMINE (BENADRYL) 25 MG tablet Take 25 mg by mouth 2 (two) times daily. Reported on 10/28/2015   No current facility-administered medications on file prior to visit.    Review of Systems Per HPI unless specifically indicated in ROS section     Objective:    BP 118/78 mmHg  Pulse 100  Temp(Src) 98.2 F (36.8 C) (Oral)  Wt 174 lb 12 oz (79.266 kg)  Wt Readings from Last 3 Encounters:  10/28/15 174 lb 12 oz (79.266 kg)  09/09/15 172 lb 8 oz (78.245 kg)  07/19/15 169 lb (76.658 kg)   Body mass index is 29.98 kg/(m^2).  Physical Exam  Constitutional: She appears well-developed and well-nourished. No distress.  HENT:  Mouth/Throat: Oropharynx is clear and moist.  Cardiovascular: Normal rate, regular rhythm, normal heart sounds and intact distal pulses.   No murmur heard. Pulmonary/Chest: Effort normal and breath sounds normal. No respiratory distress. She has no wheezes. She has no rales.  Abdominal: Soft. She exhibits no distension and no mass. There is  no tenderness. There is no rebound, no guarding and no CVA tenderness.  Musculoskeletal: She exhibits no edema.  Skin: Skin is warm and dry. No rash noted.  Psychiatric: She has a normal mood and affect.  Nursing note and vitals reviewed.  Results for orders placed or performed in visit on 10/28/15  POCT Urinalysis Dipstick (Automated)  Result Value Ref Range   Color, UA yellow    Clarity, UA hazy    Glucose, UA negative    Bilirubin, UA 1+    Ketones, UA trace    Spec Grav, UA >=1.030    Blood, UA 1+    pH, UA 5.5    Protein, UA 1+    Urobilinogen, UA 0.2    Nitrite, UA negative    Leukocytes, UA moderate (2+) (A) Negative      Assessment & Plan:    Problem List Items Addressed This Visit    Mixed Alzheimer's and vascular dementia   Hypertension    Chronic, stable. Great control.      Pedal edema    No significant pitting edema, anticipate weight related, consider amlodipine related.      Altered mental status - Primary    Presumed due to UTI s/p treatment with keflex 10d course Clinically improved. Recheck UA today - contaminated so no UCx sent. Continue to monitor clinical status      Relevant Orders   POCT Urinalysis Dipstick (Automated) (Completed)   Overweight (BMI 25.0-29.9)    Reviewed weight gain noted over last 3 years. This did start after seroquel/depakote. Will check labwork at [facility] next month. Reviewed healthy diet choices, encouraged walking but always with supervision and in safe locations only.           Follow up plan: No Follow-up on file.  Ria Bush, MD

## 2015-10-28 NOTE — Assessment & Plan Note (Signed)
No significant pitting edema, anticipate weight related, consider amlodipine related.

## 2015-10-28 NOTE — Progress Notes (Signed)
Pre visit review using our clinic review tool, if applicable. No additional management support is needed unless otherwise documented below in the visit note. 

## 2015-10-28 NOTE — Assessment & Plan Note (Signed)
Reviewed weight gain noted over last 3 years. This did start after seroquel/depakote. Will check labwork at [facility] next month. Reviewed healthy diet choices, encouraged walking but always with supervision and in safe locations only.

## 2015-10-28 NOTE — Patient Instructions (Addendum)
Work on increased fruits/vegetables in diet. Recheck urinalysis today. Keep appointment in May. Don't walk alone - if you want to walk, have someone drive you to local safe park with trails (away from cars).

## 2015-10-28 NOTE — Assessment & Plan Note (Addendum)
Presumed due to UTI s/p treatment with keflex 10d course Clinically improved. Recheck UA today - contaminated so no UCx sent. Continue to monitor clinical status

## 2015-10-29 ENCOUNTER — Telehealth: Payer: Self-pay

## 2015-10-29 NOTE — Telephone Encounter (Signed)
Arnoldo Morale pt caregiver(donot see DPR signed) said was reviewing the med list and does not think pt is taking vit D with calcium; Blanch Media request cb if pt should be taking med with instructions on how to take medication. Pt seen 10/28/15.

## 2015-10-29 NOTE — Addendum Note (Signed)
Addended by: Royann Shivers A on: 10/29/2015 10:59 AM   Modules accepted: Orders, Medications

## 2015-10-30 MED ORDER — CALCIUM-VITAMIN D 600-400 MG-UNIT PO TABS: 1.0000 | ORAL_TABLET | Freq: Every day | ORAL | Status: DC

## 2015-10-30 NOTE — Telephone Encounter (Signed)
Would have her take calcium 600mg /vit D 400 IU combo pill OTC once daily.  Also I don't see a recent bone density scan - would offer to order this to see how bone strength is doing.

## 2015-10-31 NOTE — Telephone Encounter (Signed)
Caregiver notified and will discuss DEXA with pt and son and then let you know at follow up.

## 2015-11-06 ENCOUNTER — Other Ambulatory Visit: Payer: Self-pay | Admitting: *Deleted

## 2015-11-06 MED ORDER — AMLODIPINE BESYLATE 5 MG PO TABS: 5.0000 mg | ORAL_TABLET | Freq: Every day | ORAL | Status: DC

## 2015-11-22 ENCOUNTER — Ambulatory Visit (INDEPENDENT_AMBULATORY_CARE_PROVIDER_SITE_OTHER): Payer: Medicare Other

## 2015-11-22 ENCOUNTER — Ambulatory Visit (INDEPENDENT_AMBULATORY_CARE_PROVIDER_SITE_OTHER): Payer: Medicare Other | Admitting: Family Medicine

## 2015-11-22 ENCOUNTER — Encounter: Payer: Self-pay | Admitting: Family Medicine

## 2015-11-22 VITALS — BP 122/82 | HR 91 | Temp 98.5°F | Ht 63.5 in | Wt 176.5 lb

## 2015-11-22 DIAGNOSIS — F0280 Dementia in other diseases classified elsewhere without behavioral disturbance: Secondary | ICD-10-CM

## 2015-11-22 DIAGNOSIS — Z0000 Encounter for general adult medical examination without abnormal findings: Secondary | ICD-10-CM | POA: Diagnosis not present

## 2015-11-22 DIAGNOSIS — Z1239 Encounter for other screening for malignant neoplasm of breast: Secondary | ICD-10-CM

## 2015-11-22 DIAGNOSIS — F0150 Vascular dementia without behavioral disturbance: Secondary | ICD-10-CM

## 2015-11-22 DIAGNOSIS — Z23 Encounter for immunization: Secondary | ICD-10-CM | POA: Diagnosis not present

## 2015-11-22 DIAGNOSIS — E785 Hyperlipidemia, unspecified: Secondary | ICD-10-CM

## 2015-11-22 DIAGNOSIS — G309 Alzheimer's disease, unspecified: Secondary | ICD-10-CM

## 2015-11-22 DIAGNOSIS — E669 Obesity, unspecified: Secondary | ICD-10-CM

## 2015-11-22 DIAGNOSIS — R32 Unspecified urinary incontinence: Secondary | ICD-10-CM

## 2015-11-22 DIAGNOSIS — J3489 Other specified disorders of nose and nasal sinuses: Secondary | ICD-10-CM

## 2015-11-22 DIAGNOSIS — I10 Essential (primary) hypertension: Secondary | ICD-10-CM

## 2015-11-22 DIAGNOSIS — E66811 Obesity, class 1: Secondary | ICD-10-CM

## 2015-11-22 DIAGNOSIS — E2839 Other primary ovarian failure: Secondary | ICD-10-CM | POA: Diagnosis not present

## 2015-11-22 DIAGNOSIS — Z8673 Personal history of transient ischemic attack (TIA), and cerebral infarction without residual deficits: Secondary | ICD-10-CM

## 2015-11-22 NOTE — Patient Instructions (Signed)
Hannah Howell , Thank you for taking time to come for your Medicare Wellness Visit. I appreciate your ongoing commitment to your health goals. Please review the following plan we discussed and let me know if I can assist you in the future.   These are the goals we discussed: Goals    When scheduled , I will have both an annual eye and dental exam.       This is a list of the screening recommended for you and due dates:  Health Maintenance  Topic Date Due  . DEXA scan (bone density measurement)  04/05/2016*  . Shingles Vaccine  11/21/2016*  . Tetanus Vaccine  11/21/2016*  . Pneumonia vaccines (2 of 2 - PCV13) 11/21/2016*  . Flu Shot  02/04/2016  . DTaP/Tdap/Td vaccine  Completed  *Topic was postponed. The date shown is not the original due date.    Preventive Care for Adults  A healthy lifestyle and preventive care can promote health and wellness. Preventive health guidelines for adults include the following key practices.  . A routine yearly physical is a good way to check with your health care provider about your health and preventive screening. It is a chance to share any concerns and updates on your health and to receive a thorough exam.  . Visit your dentist for a routine exam and preventive care every 6 months. Brush your teeth twice a day and floss once a day. Good oral hygiene prevents tooth decay and gum disease.  . The frequency of eye exams is based on your age, health, family medical history, use  of contact lenses, and other factors. Follow your health care provider's ecommendations for frequency of eye exams.  . Eat a healthy diet. Foods like vegetables, fruits, whole grains, low-fat dairy products, and lean protein foods contain the nutrients you need without too many calories. Decrease your intake of foods high in solid fats, added sugars, and salt. Eat the right amount of calories for you. Get information about a proper diet from your health care provider, if  necessary.  . Regular physical exercise is one of the most important things you can do for your health. Most adults should get at least 150 minutes of moderate-intensity exercise (any activity that increases your heart rate and causes you to sweat) each week. In addition, most adults need muscle-strengthening exercises on 2 or more days a week.  Silver Sneakers may be a benefit available to you. To determine eligibility, you may visit the website: www.silversneakers.com or contact program at (682)584-9624 Mon-Fri between 8AM-8PM.   . Maintain a healthy weight. The body mass index (BMI) is a screening tool to identify possible weight problems. It provides an estimate of body fat based on height and weight. Your health care provider can find your BMI and can help you achieve or maintain a healthy weight.   For adults 20 years and older: ? A BMI below 18.5 is considered underweight. ? A BMI of 18.5 to 24.9 is normal. ? A BMI of 25 to 29.9 is considered overweight. ? A BMI of 30 and above is considered obese.   . Maintain normal blood lipids and cholesterol levels by exercising and minimizing your intake of saturated fat. Eat a balanced diet with plenty of fruit and vegetables. Blood tests for lipids and cholesterol should begin at age 81 and be repeated every 5 years. If your lipid or cholesterol levels are high, you are over 50, or you are at high risk for heart  disease, you may need your cholesterol levels checked more frequently. Ongoing high lipid and cholesterol levels should be treated with medicines if diet and exercise are not working.  . If you smoke, find out from your health care provider how to quit. If you do not use tobacco, please do not start.  . If you choose to drink alcohol, please do not consume more than 2 drinks per day. One drink is considered to be 12 ounces (355 mL) of beer, 5 ounces (148 mL) of wine, or 1.5 ounces (44 mL) of liquor.  . If you are 75-52 years old, ask your  health care provider if you should take aspirin to prevent strokes.  . Use sunscreen. Apply sunscreen liberally and repeatedly throughout the day. You should seek shade when your shadow is shorter than you. Protect yourself by wearing long sleeves, pants, a wide-brimmed hat, and sunglasses year round, whenever you are outdoors.  . Once a month, do a whole body skin exam, using a mirror to look at the skin on your back. Tell your health care provider of new moles, moles that have irregular borders, moles that are larger than a pencil eraser, or moles that have changed in shape or color.

## 2015-11-22 NOTE — Progress Notes (Signed)
BP 122/82 mmHg  Pulse 91  Temp(Src) 98.5 F (36.9 C) (Oral)  Ht 5' 3.5" (1.613 m)  Wt 176 lb 8 oz (80.06 kg)  BMI 30.77 kg/m2  SpO2 93%   CC: f/u visit  Subjective:    Patient ID: Hannah Howell, female    DOB: 1940-08-09, 75 y.o.   MRN: [id number]  HPI: Hannah Howell is a 75 y.o. female presenting on 11/22/2015 for Follow-up   Recent medicare wellness visit with Katha Cabal. Note reviewed.  [place] was her birthday! She went out to dinner and received flowers and treats from out of town children.   She has not had mammogram or bone density scan.  We reviewed recommended vaccinations - patient agrees to receive prevnar today. Declines tetanus and shingles vaccines due to cost.  Normal BM's endorsed by patient. Normal voiding endorsed by patient. Denies incontinence trouble - stress or urge. Caregiver does endorse patient is having more trouble with incontinence and refuses to wear incontinence supplies like adult diapers.   Relevant past medical, surgical, family and social history reviewed and updated as indicated. Interim medical history since our last visit reviewed. Allergies and medications reviewed and updated. Current Outpatient Prescriptions on File Prior to Visit  Medication Sig  . amLODipine (NORVASC) 5 MG tablet Take 1 tablet (5 mg total) by mouth daily.  Marland Kitchen aspirin 81 MG tablet Take 81 mg by mouth daily.  . Calcium Carb-Cholecalciferol (CALCIUM-VITAMIN D) 600-400 MG-UNIT TABS Take 1 tablet by mouth daily.  . cholecalciferol (VITAMIN D) 1000 UNITS tablet Take 1,000 Units by mouth daily.  . divalproex (DEPAKOTE) 250 MG DR tablet Take 250 mg by mouth 2 (two) times daily.  Marland Kitchen donepezil (ARICEPT) 5 MG tablet Take 5 mg by mouth at bedtime.   . fish oil-omega-3 fatty acids 1000 MG capsule Take 1 g by mouth daily.  Marland Kitchen levETIRAcetam (KEPPRA) 500 MG tablet Take 500 mg by mouth 2 (two) times daily.  Marland Kitchen loratadine (CLARITIN) 10 MG tablet Take 1 tablet (10 mg total) by mouth daily.  (Patient taking differently: Take 10 mg by mouth at bedtime. )  . Naphazoline HCl (NAPHCON OP) Apply to eye as directed. 2 drops in right eye and 1 drop in left eye twice daily  . QUEtiapine (SEROQUEL) 50 MG tablet Take 50 mg by mouth at bedtime.  . vitamin B-12 (CYANOCOBALAMIN) 1000 MCG tablet Take 1,000 mcg by mouth daily.  . Zinc 50 MG TABS Take 1 tablet by mouth daily.   No current facility-administered medications on file prior to visit.    Review of Systems Per HPI unless specifically indicated in ROS section     Objective:    BP 122/82 mmHg  Pulse 91  Temp(Src) 98.5 F (36.9 C) (Oral)  Ht 5' 3.5" (1.613 m)  Wt 176 lb 8 oz (80.06 kg)  BMI 30.77 kg/m2  SpO2 93%  Wt Readings from Last 3 Encounters:  11/22/15 176 lb 8 oz (80.06 kg)  11/22/15 176 lb 8 oz (80.06 kg)  10/28/15 174 lb 12 oz (79.266 kg)    Physical Exam  Constitutional: She appears well-developed and well-nourished. No distress.  HENT:  Head: Normocephalic and atraumatic.  Mouth/Throat: Oropharynx is clear and moist. No oropharyngeal exudate.  Cardiovascular: Normal rate, regular rhythm, normal heart sounds and intact distal pulses.   No murmur heard. Pulmonary/Chest: Effort normal and breath sounds normal. No respiratory distress. She has no wheezes.  Musculoskeletal: She exhibits edema (tr pitting).  Neurological:  Less interactive  today  Skin: Skin is warm and dry. No rash noted.  Psychiatric: She has a normal mood and affect.  Nursing note and vitals reviewed.  Results for orders placed or performed in visit on 10/28/15  POCT Urinalysis Dipstick (Automated)  Result Value Ref Range   Color, UA yellow    Clarity, UA hazy    Glucose, UA negative    Bilirubin, UA 1+    Ketones, UA trace    Spec Grav, UA >=1.030    Blood, UA 1+    pH, UA 5.5    Protein, UA 1+    Urobilinogen, UA 0.2    Nitrite, UA negative    Leukocytes, UA moderate (2+) (A) Negative  -> likely contaminated specimen.       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  Will review mini cog done today when note completed.  Caregiver 24/7. Receives meds in pill packet.       History of CVA (cerebrovascular accident)    H/o CVA by imaging. Continue aspirin and good blood pressure control. Will need FP rechecked.       Dyslipidemia    Not on statin. Will need FLP updated.      Hypertension    Chronic, stable. Continue amlodipine.       Obesity, Class I, BMI 30-34.9    Continue to monitor weight.       Urinary incontinence    According to caregiver, patient denies any concerns (but h/o dementia). I suggested to patient and caregiver that they keep voiding diary with amount of times she has leaking or incontinence - to document degree of concern.  They agree to this.  Offered checking UA today - pt refused to collect clean catch.       Rhinorrhea    Better with claritin + zyrtec. Suggested nearing end of spring season to trial only one antihistamine daily.        Other Visit Diagnoses    Estrogen deficiency        Relevant Orders    DG Bone Density    Breast cancer screening        Relevant Orders    MM Digital Screening    Need for prophylactic vaccination against Streptococcus pneumoniae (pneumococcus)        Relevant Orders    Pneumococcal conjugate vaccine 13-valent IM (Completed)        Follow up plan: Return in about 4 months (around 03/24/2016), or as needed, for follow up visit.  Ria Bush, MD

## 2015-11-22 NOTE — Patient Instructions (Addendum)
Prevnar today.  I will order bone density scan and mammogram to get both done at [facility] on the same day. Pass by Marion's office to schedule this.  Urinalysis check today.  Trial only loratadine and stop zyrtec. If runny nose well controlled, stay off zyrtec.  Return in 3-4 months for follow up visit.

## 2015-11-23 ENCOUNTER — Encounter: Payer: Self-pay | Admitting: Family Medicine

## 2015-11-23 DIAGNOSIS — J3489 Other specified disorders of nose and nasal sinuses: Secondary | ICD-10-CM | POA: Insufficient documentation

## 2015-11-23 DIAGNOSIS — R32 Unspecified urinary incontinence: Secondary | ICD-10-CM | POA: Insufficient documentation

## 2015-11-23 NOTE — Assessment & Plan Note (Signed)
Chronic, stable. Continue amlodipine.  

## 2015-11-23 NOTE — Assessment & Plan Note (Signed)
According to caregiver, patient denies any concerns (but h/o dementia). I suggested to patient and caregiver that they keep voiding diary with amount of times she has leaking or incontinence - to document degree of concern.  They agree to this.  Offered checking UA today - pt refused to collect clean catch.

## 2015-11-23 NOTE — Assessment & Plan Note (Signed)
Better with claritin + zyrtec. Suggested nearing end of spring season to trial only one antihistamine daily.

## 2015-11-23 NOTE — Assessment & Plan Note (Signed)
Possible worsening - consider MMSE next visit.  Will review mini cog done today when note completed.  Caregiver 24/7. Receives meds in pill packet.

## 2015-11-23 NOTE — Assessment & Plan Note (Signed)
Continue to monitor weight.

## 2015-11-23 NOTE — Assessment & Plan Note (Signed)
H/o CVA by imaging. Continue aspirin and good blood pressure control. Will need FP rechecked.

## 2015-11-23 NOTE — Assessment & Plan Note (Addendum)
Not on statin. Will need FLP updated.
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
## 2015-11-25 NOTE — Progress Notes (Signed)
PCP notes:  Health maintenance:  PCV13 - pt declined  Shingles - postponed/insurance Tetanus - postponed/insurance Bone density - postponed/will discuss with PCP  Abnormal screenings: None  Patient concerns: None  Nurse concerns: None  Next PCP appt: 11/22/15 @ 1500

## 2015-11-25 NOTE — Progress Notes (Signed)
Pre visit review using our clinic review tool, if applicable. No additional management support is needed unless otherwise documented below in the visit note. 

## 2015-11-27 NOTE — Progress Notes (Signed)
I reviewed health advisor's note, was available for consultation, and agree with documentation and plan.  

## 2015-12-03 ENCOUNTER — Telehealth: Payer: Self-pay | Admitting: Family Medicine

## 2015-12-03 NOTE — Telephone Encounter (Signed)
Spoke to [place], caretaker. She will call the pharmacy to find out which one is her calcium. I advised her they are correct to tell her no calcium the day of the test

## 2015-12-03 NOTE — Telephone Encounter (Signed)
Pt has been set up for a bone density test.  Maziyah Pfiester is calling for the patient.  She said Ramond Craver set up the patient for her bone density test and Shirlean Mylar told her not to give the patient calcium on the day of the bone density test.  She said the patient's medication is bubble wrapped and she has no idea what has Calcium in it.  She would like a call back.  [phone]

## 2015-12-25 ENCOUNTER — Ambulatory Visit
Admission: RE | Admit: 2015-12-25 | Discharge: 2015-12-25 | Disposition: A | Discharge status: Home / Self Care | Payer: Medicare Other | Source: Ambulatory Visit | Attending: Family Medicine | Admitting: Family Medicine

## 2015-12-25 ENCOUNTER — Other Ambulatory Visit: Payer: Self-pay | Admitting: Family Medicine

## 2015-12-25 DIAGNOSIS — E2839 Other primary ovarian failure: Secondary | ICD-10-CM | POA: Insufficient documentation

## 2015-12-25 DIAGNOSIS — Z1239 Encounter for other screening for malignant neoplasm of breast: Secondary | ICD-10-CM

## 2015-12-25 DIAGNOSIS — M810 Age-related osteoporosis without current pathological fracture: Secondary | ICD-10-CM | POA: Diagnosis not present

## 2015-12-25 DIAGNOSIS — M818 Other osteoporosis without current pathological fracture: Secondary | ICD-10-CM | POA: Diagnosis not present

## 2015-12-25 DIAGNOSIS — M8588 Other specified disorders of bone density and structure, other site: Secondary | ICD-10-CM | POA: Diagnosis not present

## 2015-12-25 DIAGNOSIS — Z1231 Encounter for screening mammogram for malignant neoplasm of breast: Secondary | ICD-10-CM | POA: Insufficient documentation

## 2015-12-25 LAB — HM MAMMOGRAPHY

## 2015-12-25 LAB — HM DEXA SCAN

## 2015-12-30 ENCOUNTER — Encounter: Payer: Self-pay | Admitting: Family Medicine

## 2015-12-30 ENCOUNTER — Encounter: Payer: Self-pay | Admitting: *Deleted

## 2015-12-30 DIAGNOSIS — M810 Age-related osteoporosis without current pathological fracture: Secondary | ICD-10-CM

## 2015-12-30 HISTORY — DX: Age-related osteoporosis without current pathological fracture: M81.0

## 2016-01-01 ENCOUNTER — Telehealth: Payer: Self-pay | Admitting: Family Medicine

## 2016-01-01 NOTE — Telephone Encounter (Signed)
appt tomorrow 6pm.

## 2016-01-01 NOTE — Telephone Encounter (Signed)
Pt's son walked in to speak to PCP.  He is concerned that mom's legs are swelling, incontinence and overly sleepy.  Scheduled appointment with PCP 01/01/16, declined earlier appointment.

## 2016-01-02 ENCOUNTER — Ambulatory Visit (INDEPENDENT_AMBULATORY_CARE_PROVIDER_SITE_OTHER): Payer: Medicare Other | Admitting: Family Medicine

## 2016-01-02 ENCOUNTER — Encounter: Payer: Self-pay | Admitting: Family Medicine

## 2016-01-02 ENCOUNTER — Telehealth: Payer: Self-pay | Admitting: Family Medicine

## 2016-01-02 VITALS — BP 120/78 | HR 98 | Temp 98.2°F | Wt 175.0 lb

## 2016-01-02 DIAGNOSIS — R32 Unspecified urinary incontinence: Secondary | ICD-10-CM | POA: Diagnosis not present

## 2016-01-02 DIAGNOSIS — R600 Localized edema: Secondary | ICD-10-CM | POA: Diagnosis not present

## 2016-01-02 DIAGNOSIS — H90A22 Sensorineural hearing loss, unilateral, left ear, with restricted hearing on the contralateral side: Secondary | ICD-10-CM | POA: Insufficient documentation

## 2016-01-02 DIAGNOSIS — F0280 Dementia in other diseases classified elsewhere without behavioral disturbance: Secondary | ICD-10-CM

## 2016-01-02 DIAGNOSIS — F0150 Vascular dementia without behavioral disturbance: Secondary | ICD-10-CM

## 2016-01-02 DIAGNOSIS — H9190 Unspecified hearing loss, unspecified ear: Secondary | ICD-10-CM

## 2016-01-02 DIAGNOSIS — I10 Essential (primary) hypertension: Secondary | ICD-10-CM

## 2016-01-02 DIAGNOSIS — M810 Age-related osteoporosis without current pathological fracture: Secondary | ICD-10-CM

## 2016-01-02 DIAGNOSIS — G309 Alzheimer's disease, unspecified: Secondary | ICD-10-CM

## 2016-01-02 HISTORY — DX: Sensorineural hearing loss, unilateral, left ear, with restricted hearing on the contralateral side: H90.A22

## 2016-01-02 MED ORDER — AMLODIPINE BESYLATE 2.5 MG PO TABS: 2.5000 mg | ORAL_TABLET | Freq: Every day | ORAL | Status: DC

## 2016-01-02 MED ORDER — MIRABEGRON ER 25 MG PO TB24: 25.0000 mg | ORAL_TABLET | Freq: Every day | ORAL | Status: DC

## 2016-01-02 NOTE — Assessment & Plan Note (Addendum)
Son endorses worsening trouble, pt denies significant trouble. Sounds like urge urinary incontinence, pt refuses to wear depens/pads.  rec avoid anticholinergics due to already present cognitive limitations. Will trial myrbetriq 25mg  daily, monitor BP. Pt and son agree with plan.

## 2016-01-02 NOTE — Assessment & Plan Note (Signed)
See above. Anticipate CVI related + worsening of dependent edema on recent prolonged car ride. Given ongoing trouble, will decrease amlodipine to 2.5mg  daily and monitor for improvement. Discussed elevation of legs, avoiding salt. Consider compression stockings but pt may not tolerate.

## 2016-01-02 NOTE — Assessment & Plan Note (Signed)
Chronic, stable. Given concern for pedal edema, will decrease amlodipine to 2.5mg  daily - new dose sent to pharmacy

## 2016-01-02 NOTE — Assessment & Plan Note (Signed)
Son endorses this has worsened, they request referral to audiologist - placed.

## 2016-01-02 NOTE — Progress Notes (Signed)
BP 120/78 mmHg  Pulse 98  Temp(Src) 98.2 F (36.8 C)  Wt 175 lb (79.379 kg)  SpO2 96%   CC: discuss ankle swelling  Subjective:    Patient ID: Hannah Howell, female    DOB: Sep 13, 1940, 75 y.o.   MRN: [id number]  HPI: TYANI GERATY is a 75 y.o. female presenting on 01/02/2016 for Joint Swelling and Urinary Incontinence   Presents with son who is visiting from [place] for 2 wks. Will return on Thursday.   Recent trip to [place] - with significant dependent edema noted upon return, worrisome to son. Never dyspnea. Pt already seems to avoid salt, but she does like hotdogs.   Son worried about hearing loss. Requests audiology referral.   Increased sedation recently - daytime somnolence. Wakes up at noon. She is on quetiapine, unclear reason. Anticipate   Increased urinary accidents. Not drinking significant amount of water. No stress incontinence + urge incontinence. Does not have night time accidents  Asks about daytime adult daycare.    BP Readings from Last 3 Encounters:  01/02/16 120/78  11/22/15 122/82  11/22/15 122/82     Relevant past medical, surgical, family and social history reviewed and updated as indicated. Interim medical history since our last visit reviewed. Allergies and medications reviewed and updated. Current Outpatient Prescriptions on File Prior to Visit  Medication Sig  . aspirin 81 MG tablet Take 81 mg by mouth daily.  . Calcium Carb-Cholecalciferol (CALCIUM-VITAMIN D) 600-400 MG-UNIT TABS Take 1 tablet by mouth daily.  . cetirizine (ZYRTEC) 10 MG tablet Take 1 tablet (10 mg total) by mouth daily.  . cholecalciferol (VITAMIN D) 1000 UNITS tablet Take 1,000 Units by mouth daily.  . divalproex (DEPAKOTE) 250 MG DR tablet Take 250 mg by mouth 2 (two) times daily.  Marland Kitchen donepezil (ARICEPT) 5 MG tablet Take 5 mg by mouth at bedtime.   . fish oil-omega-3 fatty acids 1000 MG capsule Take 1 g by mouth daily.  Marland Kitchen levETIRAcetam (KEPPRA) 500 MG tablet Take 500 mg  by mouth 2 (two) times daily.  Marland Kitchen loratadine (CLARITIN) 10 MG tablet Take 1 tablet (10 mg total) by mouth daily. (Patient taking differently: Take 10 mg by mouth at bedtime. )  . Naphazoline HCl (NAPHCON OP) Apply to eye as directed. 2 drops in right eye and 1 drop in left eye twice daily  . vitamin B-12 (CYANOCOBALAMIN) 1000 MCG tablet Take 1,000 mcg by mouth daily.  . Zinc 50 MG TABS Take 1 tablet by mouth daily.   No current facility-administered medications on file prior to visit.    Review of Systems Per HPI unless specifically indicated in ROS section     Objective:    BP 120/78 mmHg  Pulse 98  Temp(Src) 98.2 F (36.8 C)  Wt 175 lb (79.379 kg)  SpO2 96%  Wt Readings from Last 3 Encounters:  01/02/16 175 lb (79.379 kg)  11/22/15 176 lb 8 oz (80.06 kg)  11/22/15 176 lb 8 oz (80.06 kg)    Physical Exam  Constitutional: She appears well-developed and well-nourished. No distress.  HENT:  Mouth/Throat: Oropharynx is clear and moist. No oropharyngeal exudate.  Eyes: Conjunctivae are normal. Pupils are equal, round, and reactive to light.  Cardiovascular: Normal rate, regular rhythm, normal heart sounds and intact distal pulses.   No murmur heard. Pulmonary/Chest: Effort normal and breath sounds normal. No respiratory distress. She has no wheezes. She has no rales.  Musculoskeletal: She exhibits edema (tr pitting, 1+ nonpitting).  1+ DP bilaterally   Neurological: She is alert.  Skin: Skin is warm and dry. No rash noted. No erythema.  Psychiatric: She has a normal mood and affect.  Nursing note and vitals reviewed.  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  Continue  aricept.  With concern over increased daytime somnolence, will decrease seroquel to 25mg  nightly. Reviewing neurology records Manuella Ghazi), this was started early 2016 after some auditory hallucinations were present. Will provide with day care resources.       Relevant Medications   QUEtiapine (SEROQUEL) 50 MG tablet   Hypertension    Chronic, stable. Given concern for pedal edema, will decrease amlodipine to 2.5mg  daily - new dose sent to pharmacy      Relevant Medications   amLODipine (NORVASC) 2.5 MG tablet   Pedal edema    See above. Anticipate CVI related + worsening of dependent edema on recent prolonged car ride. Given ongoing trouble, will decrease amlodipine to 2.5mg  daily and monitor for improvement. Discussed elevation of legs, avoiding salt. Consider compression stockings but pt may not tolerate.      Urinary incontinence    Son endorses worsening trouble, pt denies significant trouble. Sounds like urge urinary incontinence, pt refuses to wear depens/pads.  rec avoid anticholinergics due to already present cognitive limitations. Will trial myrbetriq 25mg  daily, monitor BP. Pt and son agree with plan.      Relevant Medications   mirabegron ER (MYRBETRIQ) 25 MG TB24 tablet   Osteoporosis    Discussed with patient and son. Reviewed goal calcium/vit D daily recommended requirements. Reviewed current supplementation. Discussed bisphosphonate and administration of med - son doesn't think pt will be able to take this.      Decreased hearing    Son endorses this has worsened, they request referral to audiologist - placed.      Relevant Orders   Ambulatory referral to Audiology       Follow up plan: Return in about 3 months (around 04/03/2016) for follow up visit.  Ria Bush, MD

## 2016-01-02 NOTE — Telephone Encounter (Signed)
plz call son - I did not provide with info on adult day care at office visit which he requested. Would have him check with [facility] [phone] to start.

## 2016-01-02 NOTE — Assessment & Plan Note (Signed)
Discussed with patient and son. Reviewed goal calcium/vit D daily recommended requirements. Reviewed current supplementation. Discussed bisphosphonate and administration of med - son doesn't think pt will be able to take this.

## 2016-01-02 NOTE — Patient Instructions (Addendum)
Decrease amlodipine to 2.5mg  daily (1/2 tablet). New dose will be at pharmacy. Decrease seroquel to 25mg  nightly (1/2 tablet). If doing well on 1/2 dose, try off.  We will refer you to hearing doctor (audiologist). Elevate legs, avoid salt in diet, consider compression stockings.  Trial myrbetriq for bladder.  Good to see you today, call us with questions.

## 2016-01-02 NOTE — Assessment & Plan Note (Addendum)
Stable period - actually improvement in alertness noted over the past week with son visiting.  Continue aricept.  With concern over increased daytime somnolence, will decrease seroquel to 25mg  nightly. Reviewing neurology records Hannah Howell), this was started early 2016 after some auditory hallucinations were present. Will provide with day care resources.

## 2016-01-03 NOTE — Telephone Encounter (Signed)
Patient's son notified.

## 2016-01-16 DIAGNOSIS — H90A22 Sensorineural hearing loss, unilateral, left ear, with restricted hearing on the contralateral side: Secondary | ICD-10-CM | POA: Diagnosis not present

## 2016-01-23 DIAGNOSIS — G309 Alzheimer's disease, unspecified: Secondary | ICD-10-CM | POA: Diagnosis not present

## 2016-01-23 DIAGNOSIS — F0280 Dementia in other diseases classified elsewhere without behavioral disturbance: Secondary | ICD-10-CM | POA: Diagnosis not present

## 2016-01-23 DIAGNOSIS — F0150 Vascular dementia without behavioral disturbance: Secondary | ICD-10-CM | POA: Diagnosis not present

## 2016-01-23 DIAGNOSIS — G40909 Epilepsy, unspecified, not intractable, without status epilepticus: Secondary | ICD-10-CM | POA: Diagnosis not present

## 2016-01-23 DIAGNOSIS — Z8673 Personal history of transient ischemic attack (TIA), and cerebral infarction without residual deficits: Secondary | ICD-10-CM | POA: Diagnosis not present

## 2016-01-25 DIAGNOSIS — I69311 Memory deficit following cerebral infarction: Secondary | ICD-10-CM | POA: Diagnosis not present

## 2016-01-25 DIAGNOSIS — G309 Alzheimer's disease, unspecified: Secondary | ICD-10-CM | POA: Diagnosis not present

## 2016-01-25 DIAGNOSIS — Z7982 Long term (current) use of aspirin: Secondary | ICD-10-CM | POA: Diagnosis not present

## 2016-01-25 DIAGNOSIS — Z79899 Other long term (current) drug therapy: Secondary | ICD-10-CM | POA: Diagnosis not present

## 2016-01-25 DIAGNOSIS — G40909 Epilepsy, unspecified, not intractable, without status epilepticus: Secondary | ICD-10-CM | POA: Diagnosis not present

## 2016-01-25 DIAGNOSIS — F0281 Dementia in other diseases classified elsewhere with behavioral disturbance: Secondary | ICD-10-CM | POA: Diagnosis not present

## 2016-01-25 DIAGNOSIS — F0151 Vascular dementia with behavioral disturbance: Secondary | ICD-10-CM | POA: Diagnosis not present

## 2016-01-25 DIAGNOSIS — G40409 Other generalized epilepsy and epileptic syndromes, not intractable, without status epilepticus: Secondary | ICD-10-CM | POA: Diagnosis not present

## 2016-01-26 ENCOUNTER — Encounter: Payer: Self-pay | Admitting: Family Medicine

## 2016-01-29 DIAGNOSIS — I69311 Memory deficit following cerebral infarction: Secondary | ICD-10-CM | POA: Diagnosis not present

## 2016-01-29 DIAGNOSIS — G309 Alzheimer's disease, unspecified: Secondary | ICD-10-CM | POA: Diagnosis not present

## 2016-01-29 DIAGNOSIS — Z7982 Long term (current) use of aspirin: Secondary | ICD-10-CM | POA: Diagnosis not present

## 2016-01-29 DIAGNOSIS — G40409 Other generalized epilepsy and epileptic syndromes, not intractable, without status epilepticus: Secondary | ICD-10-CM | POA: Diagnosis not present

## 2016-01-29 DIAGNOSIS — F0151 Vascular dementia with behavioral disturbance: Secondary | ICD-10-CM | POA: Diagnosis not present

## 2016-01-29 DIAGNOSIS — F0281 Dementia in other diseases classified elsewhere with behavioral disturbance: Secondary | ICD-10-CM | POA: Diagnosis not present

## 2016-01-30 DIAGNOSIS — Z7982 Long term (current) use of aspirin: Secondary | ICD-10-CM | POA: Diagnosis not present

## 2016-01-30 DIAGNOSIS — F0151 Vascular dementia with behavioral disturbance: Secondary | ICD-10-CM | POA: Diagnosis not present

## 2016-01-30 DIAGNOSIS — G40409 Other generalized epilepsy and epileptic syndromes, not intractable, without status epilepticus: Secondary | ICD-10-CM | POA: Diagnosis not present

## 2016-01-30 DIAGNOSIS — F0281 Dementia in other diseases classified elsewhere with behavioral disturbance: Secondary | ICD-10-CM | POA: Diagnosis not present

## 2016-01-30 DIAGNOSIS — G309 Alzheimer's disease, unspecified: Secondary | ICD-10-CM | POA: Diagnosis not present

## 2016-01-30 DIAGNOSIS — I69311 Memory deficit following cerebral infarction: Secondary | ICD-10-CM | POA: Diagnosis not present

## 2016-02-03 DIAGNOSIS — F0151 Vascular dementia with behavioral disturbance: Secondary | ICD-10-CM | POA: Diagnosis not present

## 2016-02-03 DIAGNOSIS — G309 Alzheimer's disease, unspecified: Secondary | ICD-10-CM | POA: Diagnosis not present

## 2016-02-03 DIAGNOSIS — Z7982 Long term (current) use of aspirin: Secondary | ICD-10-CM | POA: Diagnosis not present

## 2016-02-03 DIAGNOSIS — G40409 Other generalized epilepsy and epileptic syndromes, not intractable, without status epilepticus: Secondary | ICD-10-CM | POA: Diagnosis not present

## 2016-02-03 DIAGNOSIS — I69311 Memory deficit following cerebral infarction: Secondary | ICD-10-CM | POA: Diagnosis not present

## 2016-02-03 DIAGNOSIS — F0281 Dementia in other diseases classified elsewhere with behavioral disturbance: Secondary | ICD-10-CM | POA: Diagnosis not present

## 2016-02-04 DIAGNOSIS — F0281 Dementia in other diseases classified elsewhere with behavioral disturbance: Secondary | ICD-10-CM | POA: Diagnosis not present

## 2016-02-04 DIAGNOSIS — I69311 Memory deficit following cerebral infarction: Secondary | ICD-10-CM | POA: Diagnosis not present

## 2016-02-04 DIAGNOSIS — G40409 Other generalized epilepsy and epileptic syndromes, not intractable, without status epilepticus: Secondary | ICD-10-CM | POA: Diagnosis not present

## 2016-02-04 DIAGNOSIS — F0151 Vascular dementia with behavioral disturbance: Secondary | ICD-10-CM | POA: Diagnosis not present

## 2016-02-04 DIAGNOSIS — G309 Alzheimer's disease, unspecified: Secondary | ICD-10-CM | POA: Diagnosis not present

## 2016-02-04 DIAGNOSIS — Z7982 Long term (current) use of aspirin: Secondary | ICD-10-CM | POA: Diagnosis not present

## 2016-02-06 DIAGNOSIS — F0281 Dementia in other diseases classified elsewhere with behavioral disturbance: Secondary | ICD-10-CM | POA: Diagnosis not present

## 2016-02-06 DIAGNOSIS — G40409 Other generalized epilepsy and epileptic syndromes, not intractable, without status epilepticus: Secondary | ICD-10-CM | POA: Diagnosis not present

## 2016-02-06 DIAGNOSIS — Z7982 Long term (current) use of aspirin: Secondary | ICD-10-CM | POA: Diagnosis not present

## 2016-02-06 DIAGNOSIS — F0151 Vascular dementia with behavioral disturbance: Secondary | ICD-10-CM | POA: Diagnosis not present

## 2016-02-06 DIAGNOSIS — I69311 Memory deficit following cerebral infarction: Secondary | ICD-10-CM | POA: Diagnosis not present

## 2016-02-06 DIAGNOSIS — G309 Alzheimer's disease, unspecified: Secondary | ICD-10-CM | POA: Diagnosis not present

## 2016-02-07 DIAGNOSIS — H903 Sensorineural hearing loss, bilateral: Secondary | ICD-10-CM | POA: Diagnosis not present

## 2016-02-07 DIAGNOSIS — H6122 Impacted cerumen, left ear: Secondary | ICD-10-CM | POA: Diagnosis not present

## 2016-02-11 DIAGNOSIS — G309 Alzheimer's disease, unspecified: Secondary | ICD-10-CM | POA: Diagnosis not present

## 2016-02-11 DIAGNOSIS — F0281 Dementia in other diseases classified elsewhere with behavioral disturbance: Secondary | ICD-10-CM | POA: Diagnosis not present

## 2016-02-11 DIAGNOSIS — F0151 Vascular dementia with behavioral disturbance: Secondary | ICD-10-CM | POA: Diagnosis not present

## 2016-02-11 DIAGNOSIS — I69311 Memory deficit following cerebral infarction: Secondary | ICD-10-CM | POA: Diagnosis not present

## 2016-02-11 DIAGNOSIS — Z7982 Long term (current) use of aspirin: Secondary | ICD-10-CM | POA: Diagnosis not present

## 2016-02-11 DIAGNOSIS — G40409 Other generalized epilepsy and epileptic syndromes, not intractable, without status epilepticus: Secondary | ICD-10-CM | POA: Diagnosis not present

## 2016-02-13 DIAGNOSIS — I69311 Memory deficit following cerebral infarction: Secondary | ICD-10-CM | POA: Diagnosis not present

## 2016-02-13 DIAGNOSIS — F0151 Vascular dementia with behavioral disturbance: Secondary | ICD-10-CM | POA: Diagnosis not present

## 2016-02-13 DIAGNOSIS — G309 Alzheimer's disease, unspecified: Secondary | ICD-10-CM | POA: Diagnosis not present

## 2016-02-13 DIAGNOSIS — G40409 Other generalized epilepsy and epileptic syndromes, not intractable, without status epilepticus: Secondary | ICD-10-CM | POA: Diagnosis not present

## 2016-02-13 DIAGNOSIS — F0281 Dementia in other diseases classified elsewhere with behavioral disturbance: Secondary | ICD-10-CM | POA: Diagnosis not present

## 2016-02-13 DIAGNOSIS — Z7982 Long term (current) use of aspirin: Secondary | ICD-10-CM | POA: Diagnosis not present

## 2016-02-14 DIAGNOSIS — G309 Alzheimer's disease, unspecified: Secondary | ICD-10-CM | POA: Diagnosis not present

## 2016-02-14 DIAGNOSIS — I69311 Memory deficit following cerebral infarction: Secondary | ICD-10-CM | POA: Diagnosis not present

## 2016-02-14 DIAGNOSIS — G40409 Other generalized epilepsy and epileptic syndromes, not intractable, without status epilepticus: Secondary | ICD-10-CM | POA: Diagnosis not present

## 2016-02-14 DIAGNOSIS — F0281 Dementia in other diseases classified elsewhere with behavioral disturbance: Secondary | ICD-10-CM | POA: Diagnosis not present

## 2016-02-14 DIAGNOSIS — Z7982 Long term (current) use of aspirin: Secondary | ICD-10-CM | POA: Diagnosis not present

## 2016-02-14 DIAGNOSIS — F0151 Vascular dementia with behavioral disturbance: Secondary | ICD-10-CM | POA: Diagnosis not present

## 2016-02-18 DIAGNOSIS — F0281 Dementia in other diseases classified elsewhere with behavioral disturbance: Secondary | ICD-10-CM | POA: Diagnosis not present

## 2016-02-18 DIAGNOSIS — G40409 Other generalized epilepsy and epileptic syndromes, not intractable, without status epilepticus: Secondary | ICD-10-CM | POA: Diagnosis not present

## 2016-02-18 DIAGNOSIS — I69311 Memory deficit following cerebral infarction: Secondary | ICD-10-CM | POA: Diagnosis not present

## 2016-02-18 DIAGNOSIS — Z7982 Long term (current) use of aspirin: Secondary | ICD-10-CM | POA: Diagnosis not present

## 2016-02-18 DIAGNOSIS — G309 Alzheimer's disease, unspecified: Secondary | ICD-10-CM | POA: Diagnosis not present

## 2016-02-18 DIAGNOSIS — F0151 Vascular dementia with behavioral disturbance: Secondary | ICD-10-CM | POA: Diagnosis not present

## 2016-02-19 DIAGNOSIS — Z7982 Long term (current) use of aspirin: Secondary | ICD-10-CM | POA: Diagnosis not present

## 2016-02-19 DIAGNOSIS — G309 Alzheimer's disease, unspecified: Secondary | ICD-10-CM | POA: Diagnosis not present

## 2016-02-19 DIAGNOSIS — G40409 Other generalized epilepsy and epileptic syndromes, not intractable, without status epilepticus: Secondary | ICD-10-CM | POA: Diagnosis not present

## 2016-02-19 DIAGNOSIS — F0151 Vascular dementia with behavioral disturbance: Secondary | ICD-10-CM | POA: Diagnosis not present

## 2016-02-19 DIAGNOSIS — I69311 Memory deficit following cerebral infarction: Secondary | ICD-10-CM | POA: Diagnosis not present

## 2016-02-19 DIAGNOSIS — F0281 Dementia in other diseases classified elsewhere with behavioral disturbance: Secondary | ICD-10-CM | POA: Diagnosis not present

## 2016-02-25 DIAGNOSIS — I69311 Memory deficit following cerebral infarction: Secondary | ICD-10-CM | POA: Diagnosis not present

## 2016-02-25 DIAGNOSIS — Z7982 Long term (current) use of aspirin: Secondary | ICD-10-CM | POA: Diagnosis not present

## 2016-02-25 DIAGNOSIS — F0151 Vascular dementia with behavioral disturbance: Secondary | ICD-10-CM | POA: Diagnosis not present

## 2016-02-25 DIAGNOSIS — G40409 Other generalized epilepsy and epileptic syndromes, not intractable, without status epilepticus: Secondary | ICD-10-CM | POA: Diagnosis not present

## 2016-02-25 DIAGNOSIS — G309 Alzheimer's disease, unspecified: Secondary | ICD-10-CM | POA: Diagnosis not present

## 2016-02-25 DIAGNOSIS — F0281 Dementia in other diseases classified elsewhere with behavioral disturbance: Secondary | ICD-10-CM | POA: Diagnosis not present

## 2016-02-27 DIAGNOSIS — I69311 Memory deficit following cerebral infarction: Secondary | ICD-10-CM | POA: Diagnosis not present

## 2016-02-27 DIAGNOSIS — G40409 Other generalized epilepsy and epileptic syndromes, not intractable, without status epilepticus: Secondary | ICD-10-CM | POA: Diagnosis not present

## 2016-02-27 DIAGNOSIS — G309 Alzheimer's disease, unspecified: Secondary | ICD-10-CM | POA: Diagnosis not present

## 2016-02-27 DIAGNOSIS — F0151 Vascular dementia with behavioral disturbance: Secondary | ICD-10-CM | POA: Diagnosis not present

## 2016-02-27 DIAGNOSIS — Z7982 Long term (current) use of aspirin: Secondary | ICD-10-CM | POA: Diagnosis not present

## 2016-02-27 DIAGNOSIS — F0281 Dementia in other diseases classified elsewhere with behavioral disturbance: Secondary | ICD-10-CM | POA: Diagnosis not present

## 2016-03-03 DIAGNOSIS — G309 Alzheimer's disease, unspecified: Secondary | ICD-10-CM | POA: Diagnosis not present

## 2016-03-03 DIAGNOSIS — G40409 Other generalized epilepsy and epileptic syndromes, not intractable, without status epilepticus: Secondary | ICD-10-CM | POA: Diagnosis not present

## 2016-03-03 DIAGNOSIS — F0281 Dementia in other diseases classified elsewhere with behavioral disturbance: Secondary | ICD-10-CM | POA: Diagnosis not present

## 2016-03-03 DIAGNOSIS — Z7982 Long term (current) use of aspirin: Secondary | ICD-10-CM | POA: Diagnosis not present

## 2016-03-03 DIAGNOSIS — F0151 Vascular dementia with behavioral disturbance: Secondary | ICD-10-CM | POA: Diagnosis not present

## 2016-03-03 DIAGNOSIS — I69311 Memory deficit following cerebral infarction: Secondary | ICD-10-CM | POA: Diagnosis not present

## 2016-03-05 DIAGNOSIS — F0281 Dementia in other diseases classified elsewhere with behavioral disturbance: Secondary | ICD-10-CM | POA: Diagnosis not present

## 2016-03-05 DIAGNOSIS — F0151 Vascular dementia with behavioral disturbance: Secondary | ICD-10-CM | POA: Diagnosis not present

## 2016-03-05 DIAGNOSIS — Z7982 Long term (current) use of aspirin: Secondary | ICD-10-CM | POA: Diagnosis not present

## 2016-03-05 DIAGNOSIS — G40409 Other generalized epilepsy and epileptic syndromes, not intractable, without status epilepticus: Secondary | ICD-10-CM | POA: Diagnosis not present

## 2016-03-05 DIAGNOSIS — G309 Alzheimer's disease, unspecified: Secondary | ICD-10-CM | POA: Diagnosis not present

## 2016-03-05 DIAGNOSIS — I69311 Memory deficit following cerebral infarction: Secondary | ICD-10-CM | POA: Diagnosis not present

## 2016-03-26 ENCOUNTER — Telehealth: Payer: Self-pay | Admitting: Family Medicine

## 2016-03-26 DIAGNOSIS — F0280 Dementia in other diseases classified elsewhere without behavioral disturbance: Principal | ICD-10-CM

## 2016-03-26 DIAGNOSIS — G309 Alzheimer's disease, unspecified: Principal | ICD-10-CM

## 2016-03-26 DIAGNOSIS — F0150 Vascular dementia without behavioral disturbance: Secondary | ICD-10-CM

## 2016-03-26 NOTE — Telephone Encounter (Signed)
Left message for Hannah Howell that forms were up front ready for pickup

## 2016-03-26 NOTE — Telephone Encounter (Signed)
Randi,[employer]ult Protective Services,called.  They have sent over a request for medical records and FL-2 to be filled out.  They want to place her in a facility today.  Lala Lund is asking for Dr.Gutierrez to call her back as soon as possible.  They are hoping to file an emergency protective order today.
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
## 2016-03-26 NOTE — Telephone Encounter (Signed)
Hannah Howell called wanting to know if we could fax paperwork to her office fax machine [phone] She also wanted last years worth of office notes. I let her know I could not print the last years notes and send to her.  She wanted the last 2 office notes.  I spoke with Leafy Ro and Junie Panning and they ok'd for me to faxed last 2 office notes.  Hannah Howell also wanted last years notes .  I put in folder to send to [employer] to have them copied and send to Hannah Howell Hannah Howell aware  Paperwork faxed also with last 2 office notes

## 2016-04-06 ENCOUNTER — Telehealth: Payer: Self-pay | Admitting: *Deleted

## 2016-04-06 NOTE — Telephone Encounter (Signed)
PT son came in requesting a letter regarding her being disabled to be able to take it to social security administration to get SSI to help with the cost of assisted living. He is in town trying to get everything straightened out for her and is planning to take her to [place] to live eventually because they have family up there and he is currently living in [place]. Please give him a call when it is completed and/or if you have any questions. [phone].

## 2016-04-07 NOTE — Telephone Encounter (Signed)
Message left advising patient's son and letter placed up front for pick up.

## 2016-04-07 NOTE — Telephone Encounter (Signed)
Letter written and in chart 

## 2016-04-15 ENCOUNTER — Telehealth: Payer: Self-pay | Admitting: Family Medicine

## 2016-04-15 NOTE — Telephone Encounter (Signed)
Son came in - pt is going to [facility] - pt needs form filled out and faxed to [phone] attention to [facility]  Call back number for [facility] is [phone] Thank you  Placing in rx tower

## 2016-04-16 ENCOUNTER — Telehealth: Payer: Self-pay | Admitting: Family Medicine

## 2016-04-16 DIAGNOSIS — Z7689 Persons encountering health services in other specified circumstances: Secondary | ICD-10-CM

## 2016-04-16 NOTE — Telephone Encounter (Signed)
In your IN box 

## 2016-04-16 NOTE — Telephone Encounter (Signed)
Filled and in Kim's box. 

## 2016-04-16 NOTE — Telephone Encounter (Signed)
Kylie called to discuss this patient's care.  She is the adult care Education officer, [employer].  She can be reached in her office at [phone] or on her cell phone at [phone]

## 2016-04-16 NOTE — Telephone Encounter (Signed)
Hannah Howell called back to discuss Hannah Howell's case. She has a guardianship petition and pt son wants to move Hannah Howell.

## 2016-04-16 NOTE — Telephone Encounter (Signed)
Filled and in Kim's box.  plz refill meds. Thanks.

## 2016-04-16 NOTE — Telephone Encounter (Signed)
Spoke with Kylie. She is with [employer]. She said in order for their petition with the court for Adult Protective Services, she needed to ensure that Dr. Darnell Level was onboard for patient's placement and that FL2 would be completed. I assured her that he was onboard and that [facility] would be completed and faxed to [facility]ib[facility]ings [place] at [phone] or can be pick up by patient's son tomorrow.   She also advised that patient would need Rx's sent to pharmacy at the facility because she only has 2 weeks left on her current meds. Changed in chart  [place]  (p) [phone]

## 2016-04-17 ENCOUNTER — Other Ambulatory Visit: Payer: Self-pay | Admitting: *Deleted

## 2016-04-17 MED ORDER — DIVALPROEX SODIUM 250 MG PO DR TAB: 250.0000 mg | DELAYED_RELEASE_TABLET | Freq: Two times a day (BID) | ORAL | 0 refills | Status: AC

## 2016-04-17 MED ORDER — AMLODIPINE BESYLATE 2.5 MG PO TABS: 2.5000 mg | ORAL_TABLET | Freq: Every day | ORAL | 0 refills | Status: AC

## 2016-04-17 MED ORDER — DONEPEZIL HCL 5 MG PO TABS: 5.0000 mg | ORAL_TABLET | Freq: Every day | ORAL | 0 refills | Status: AC

## 2016-04-17 MED ORDER — QUETIAPINE FUMARATE 50 MG PO TABS: 25.0000 mg | ORAL_TABLET | Freq: Every day | ORAL | 0 refills | Status: AC

## 2016-04-17 MED ORDER — MIRABEGRON ER 25 MG PO TB24: 25.0000 mg | ORAL_TABLET | Freq: Every day | ORAL | 0 refills | Status: AC

## 2016-04-17 MED ORDER — FISH OIL 1000 MG PO CAPS: 1.0000 | ORAL_CAPSULE | Freq: Every day | ORAL | 0 refills | Status: AC

## 2016-04-17 MED ORDER — LEVETIRACETAM 500 MG PO TABS: 500.0000 mg | ORAL_TABLET | Freq: Two times a day (BID) | ORAL | 0 refills | Status: AC

## 2016-04-17 MED ORDER — ZINC 50 MG PO TABS: 1.0000 | ORAL_TABLET | Freq: Every day | ORAL | 0 refills | Status: AC

## 2016-04-17 MED ORDER — VITAMIN D 1000 UNITS PO TABS: 1000.0000 [IU] | ORAL_TABLET | Freq: Every day | ORAL | 0 refills | Status: AC

## 2016-04-17 MED ORDER — CALCIUM-VITAMIN D 600-400 MG-UNIT PO TABS: 1.0000 | ORAL_TABLET | Freq: Every day | ORAL | 0 refills | Status: AC

## 2016-04-17 MED ORDER — VITAMIN B-12 1000 MCG PO TABS: 1000.0000 ug | ORAL_TABLET | Freq: Every day | ORAL | 0 refills | Status: AC

## 2016-04-17 MED ORDER — ASPIRIN 81 MG PO TABS: 81.0000 mg | ORAL_TABLET | Freq: Every day | ORAL | 0 refills | Status: AC

## 2016-04-17 MED ORDER — LORATADINE 10 MG PO TABS: 10.0000 mg | ORAL_TABLET | Freq: Every day | ORAL | 0 refills | Status: AC

## 2016-04-17 MED ORDER — CETIRIZINE HCL 10 MG PO TABS: 10.0000 mg | ORAL_TABLET | Freq: Every day | ORAL | 0 refills | Status: DC

## 2016-04-17 NOTE — Telephone Encounter (Signed)
Forms faxed and meds refilled. Son notified. He wanted to say thank you for the care his mom has received here.

## 2016-04-20 ENCOUNTER — Telehealth: Payer: Self-pay

## 2016-04-20 NOTE — Telephone Encounter (Signed)
Hannah Howell with [name] left v/m wanting to verify that pt is supposed to take generic claritin and generic zyrtec. [name] request cb. Pt is a resident at nursing home in [place].

## 2016-04-20 NOTE — Telephone Encounter (Signed)
Levada Dy at pharmacy notified as instructed by telephone and verbalized understanding.

## 2016-04-20 NOTE — Telephone Encounter (Signed)
Let's just take claritin 10mg  daily, stop zyrtec.  We were on both due to persistent rhinorrhea despite one antihistamine.

## 2016-04-30 DIAGNOSIS — G4089 Other seizures: Secondary | ICD-10-CM | POA: Diagnosis not present

## 2016-04-30 DIAGNOSIS — I10 Essential (primary) hypertension: Secondary | ICD-10-CM | POA: Diagnosis not present

## 2016-04-30 DIAGNOSIS — F0280 Dementia in other diseases classified elsewhere without behavioral disturbance: Secondary | ICD-10-CM | POA: Diagnosis not present

## 2016-05-05 DIAGNOSIS — S199XXA Unspecified injury of neck, initial encounter: Secondary | ICD-10-CM | POA: Diagnosis not present

## 2016-05-05 DIAGNOSIS — I10 Essential (primary) hypertension: Secondary | ICD-10-CM | POA: Diagnosis not present

## 2016-05-05 DIAGNOSIS — R4182 Altered mental status, unspecified: Secondary | ICD-10-CM | POA: Diagnosis not present

## 2016-05-05 DIAGNOSIS — S6992XA Unspecified injury of left wrist, hand and finger(s), initial encounter: Secondary | ICD-10-CM | POA: Diagnosis not present

## 2016-05-05 DIAGNOSIS — E538 Deficiency of other specified B group vitamins: Secondary | ICD-10-CM | POA: Diagnosis not present

## 2016-05-05 DIAGNOSIS — N3281 Overactive bladder: Secondary | ICD-10-CM | POA: Diagnosis not present

## 2016-05-05 DIAGNOSIS — S0003XA Contusion of scalp, initial encounter: Secondary | ICD-10-CM | POA: Diagnosis not present

## 2016-05-05 DIAGNOSIS — F329 Major depressive disorder, single episode, unspecified: Secondary | ICD-10-CM | POA: Diagnosis not present

## 2016-05-05 DIAGNOSIS — Z9989 Dependence on other enabling machines and devices: Secondary | ICD-10-CM | POA: Diagnosis not present

## 2016-05-05 DIAGNOSIS — I4891 Unspecified atrial fibrillation: Secondary | ICD-10-CM | POA: Diagnosis not present

## 2016-05-05 DIAGNOSIS — G40909 Epilepsy, unspecified, not intractable, without status epilepticus: Secondary | ICD-10-CM | POA: Diagnosis not present

## 2016-05-05 DIAGNOSIS — N390 Urinary tract infection, site not specified: Secondary | ICD-10-CM | POA: Diagnosis not present

## 2016-05-05 DIAGNOSIS — E785 Hyperlipidemia, unspecified: Secondary | ICD-10-CM | POA: Diagnosis not present

## 2016-05-05 DIAGNOSIS — S299XXA Unspecified injury of thorax, initial encounter: Secondary | ICD-10-CM | POA: Diagnosis not present

## 2016-05-06 DIAGNOSIS — I10 Essential (primary) hypertension: Secondary | ICD-10-CM | POA: Diagnosis not present

## 2016-05-06 DIAGNOSIS — G4089 Other seizures: Secondary | ICD-10-CM | POA: Diagnosis not present

## 2016-05-06 DIAGNOSIS — R569 Unspecified convulsions: Secondary | ICD-10-CM | POA: Diagnosis not present

## 2016-05-07 DIAGNOSIS — Z7982 Long term (current) use of aspirin: Secondary | ICD-10-CM | POA: Diagnosis not present

## 2016-05-07 DIAGNOSIS — S0003XA Contusion of scalp, initial encounter: Secondary | ICD-10-CM | POA: Diagnosis present

## 2016-05-07 DIAGNOSIS — I10 Essential (primary) hypertension: Secondary | ICD-10-CM | POA: Diagnosis present

## 2016-05-07 DIAGNOSIS — R8279 Other abnormal findings on microbiological examination of urine: Secondary | ICD-10-CM | POA: Diagnosis present

## 2016-05-07 DIAGNOSIS — E538 Deficiency of other specified B group vitamins: Secondary | ICD-10-CM | POA: Diagnosis present

## 2016-05-07 DIAGNOSIS — N3281 Overactive bladder: Secondary | ICD-10-CM | POA: Diagnosis present

## 2016-05-07 DIAGNOSIS — F329 Major depressive disorder, single episode, unspecified: Secondary | ICD-10-CM | POA: Diagnosis present

## 2016-05-07 DIAGNOSIS — G40909 Epilepsy, unspecified, not intractable, without status epilepticus: Secondary | ICD-10-CM | POA: Diagnosis present

## 2016-05-07 DIAGNOSIS — E785 Hyperlipidemia, unspecified: Secondary | ICD-10-CM | POA: Diagnosis present

## 2016-05-07 DIAGNOSIS — G4089 Other seizures: Secondary | ICD-10-CM | POA: Diagnosis not present

## 2016-05-07 DIAGNOSIS — I4891 Unspecified atrial fibrillation: Secondary | ICD-10-CM | POA: Diagnosis present

## 2016-05-07 DIAGNOSIS — N390 Urinary tract infection, site not specified: Secondary | ICD-10-CM | POA: Diagnosis present

## 2016-05-07 DIAGNOSIS — E559 Vitamin D deficiency, unspecified: Secondary | ICD-10-CM | POA: Diagnosis present

## 2016-05-13 DIAGNOSIS — H9190 Unspecified hearing loss, unspecified ear: Secondary | ICD-10-CM | POA: Diagnosis not present

## 2016-05-13 DIAGNOSIS — Z87440 Personal history of urinary (tract) infections: Secondary | ICD-10-CM | POA: Diagnosis not present

## 2016-05-13 DIAGNOSIS — R569 Unspecified convulsions: Secondary | ICD-10-CM | POA: Diagnosis not present

## 2016-05-13 DIAGNOSIS — I10 Essential (primary) hypertension: Secondary | ICD-10-CM | POA: Diagnosis not present

## 2016-05-13 DIAGNOSIS — Z8673 Personal history of transient ischemic attack (TIA), and cerebral infarction without residual deficits: Secondary | ICD-10-CM | POA: Diagnosis not present

## 2016-05-13 DIAGNOSIS — F0280 Dementia in other diseases classified elsewhere without behavioral disturbance: Secondary | ICD-10-CM | POA: Diagnosis not present

## 2016-05-13 DIAGNOSIS — Z7982 Long term (current) use of aspirin: Secondary | ICD-10-CM | POA: Diagnosis not present

## 2016-05-13 DIAGNOSIS — G309 Alzheimer's disease, unspecified: Secondary | ICD-10-CM | POA: Diagnosis not present

## 2016-05-14 ENCOUNTER — Telehealth: Payer: Self-pay | Admitting: Family Medicine

## 2016-05-14 DIAGNOSIS — Z87440 Personal history of urinary (tract) infections: Secondary | ICD-10-CM | POA: Diagnosis not present

## 2016-05-14 DIAGNOSIS — R569 Unspecified convulsions: Secondary | ICD-10-CM | POA: Diagnosis not present

## 2016-05-14 DIAGNOSIS — I10 Essential (primary) hypertension: Secondary | ICD-10-CM | POA: Diagnosis not present

## 2016-05-14 DIAGNOSIS — F0280 Dementia in other diseases classified elsewhere without behavioral disturbance: Secondary | ICD-10-CM | POA: Diagnosis not present

## 2016-05-14 DIAGNOSIS — Z8673 Personal history of transient ischemic attack (TIA), and cerebral infarction without residual deficits: Secondary | ICD-10-CM | POA: Diagnosis not present

## 2016-05-14 DIAGNOSIS — G309 Alzheimer's disease, unspecified: Secondary | ICD-10-CM | POA: Diagnosis not present

## 2016-05-14 NOTE — Telephone Encounter (Signed)
Dr. Darnell Level is aware. Thanks!

## 2016-05-14 NOTE — Telephone Encounter (Signed)
Patient's former caretaker,Khristin,called to let Dr.Gutierrez know that patient is in  assisted living in [place].

## 2016-05-15 DIAGNOSIS — F0280 Dementia in other diseases classified elsewhere without behavioral disturbance: Secondary | ICD-10-CM | POA: Diagnosis not present

## 2016-05-15 DIAGNOSIS — Z8673 Personal history of transient ischemic attack (TIA), and cerebral infarction without residual deficits: Secondary | ICD-10-CM | POA: Diagnosis not present

## 2016-05-15 DIAGNOSIS — G309 Alzheimer's disease, unspecified: Secondary | ICD-10-CM | POA: Diagnosis not present

## 2016-05-15 DIAGNOSIS — I10 Essential (primary) hypertension: Secondary | ICD-10-CM | POA: Diagnosis not present

## 2016-05-15 DIAGNOSIS — R569 Unspecified convulsions: Secondary | ICD-10-CM | POA: Diagnosis not present

## 2016-05-15 DIAGNOSIS — Z87440 Personal history of urinary (tract) infections: Secondary | ICD-10-CM | POA: Diagnosis not present

## 2016-05-18 DIAGNOSIS — I10 Essential (primary) hypertension: Secondary | ICD-10-CM | POA: Diagnosis not present

## 2016-05-18 DIAGNOSIS — Z8673 Personal history of transient ischemic attack (TIA), and cerebral infarction without residual deficits: Secondary | ICD-10-CM | POA: Diagnosis not present

## 2016-05-18 DIAGNOSIS — Z87440 Personal history of urinary (tract) infections: Secondary | ICD-10-CM | POA: Diagnosis not present

## 2016-05-18 DIAGNOSIS — G309 Alzheimer's disease, unspecified: Secondary | ICD-10-CM | POA: Diagnosis not present

## 2016-05-18 DIAGNOSIS — F0280 Dementia in other diseases classified elsewhere without behavioral disturbance: Secondary | ICD-10-CM | POA: Diagnosis not present

## 2016-05-18 DIAGNOSIS — R569 Unspecified convulsions: Secondary | ICD-10-CM | POA: Diagnosis not present

## 2016-05-20 DIAGNOSIS — G309 Alzheimer's disease, unspecified: Secondary | ICD-10-CM | POA: Diagnosis not present

## 2016-05-20 DIAGNOSIS — I10 Essential (primary) hypertension: Secondary | ICD-10-CM | POA: Diagnosis not present

## 2016-05-20 DIAGNOSIS — R569 Unspecified convulsions: Secondary | ICD-10-CM | POA: Diagnosis not present

## 2016-05-20 DIAGNOSIS — F0280 Dementia in other diseases classified elsewhere without behavioral disturbance: Secondary | ICD-10-CM | POA: Diagnosis not present

## 2016-05-20 DIAGNOSIS — Z87440 Personal history of urinary (tract) infections: Secondary | ICD-10-CM | POA: Diagnosis not present

## 2016-05-20 DIAGNOSIS — Z8673 Personal history of transient ischemic attack (TIA), and cerebral infarction without residual deficits: Secondary | ICD-10-CM | POA: Diagnosis not present

## 2016-05-21 NOTE — Telephone Encounter (Signed)
Opened in error

## 2016-05-25 DIAGNOSIS — N390 Urinary tract infection, site not specified: Secondary | ICD-10-CM | POA: Diagnosis not present

## 2016-05-26 DIAGNOSIS — R569 Unspecified convulsions: Secondary | ICD-10-CM | POA: Diagnosis not present

## 2016-05-26 DIAGNOSIS — F0280 Dementia in other diseases classified elsewhere without behavioral disturbance: Secondary | ICD-10-CM | POA: Diagnosis not present

## 2016-05-26 DIAGNOSIS — Z8673 Personal history of transient ischemic attack (TIA), and cerebral infarction without residual deficits: Secondary | ICD-10-CM | POA: Diagnosis not present

## 2016-05-26 DIAGNOSIS — I10 Essential (primary) hypertension: Secondary | ICD-10-CM | POA: Diagnosis not present

## 2016-05-26 DIAGNOSIS — Z87440 Personal history of urinary (tract) infections: Secondary | ICD-10-CM | POA: Diagnosis not present

## 2016-05-26 DIAGNOSIS — G309 Alzheimer's disease, unspecified: Secondary | ICD-10-CM | POA: Diagnosis not present

## 2016-05-29 DIAGNOSIS — F0280 Dementia in other diseases classified elsewhere without behavioral disturbance: Secondary | ICD-10-CM | POA: Diagnosis not present

## 2016-05-29 DIAGNOSIS — Z87440 Personal history of urinary (tract) infections: Secondary | ICD-10-CM | POA: Diagnosis not present

## 2016-05-29 DIAGNOSIS — Z8673 Personal history of transient ischemic attack (TIA), and cerebral infarction without residual deficits: Secondary | ICD-10-CM | POA: Diagnosis not present

## 2016-05-29 DIAGNOSIS — G309 Alzheimer's disease, unspecified: Secondary | ICD-10-CM | POA: Diagnosis not present

## 2016-05-29 DIAGNOSIS — I10 Essential (primary) hypertension: Secondary | ICD-10-CM | POA: Diagnosis not present

## 2016-05-29 DIAGNOSIS — R569 Unspecified convulsions: Secondary | ICD-10-CM | POA: Diagnosis not present

## 2016-05-30 DIAGNOSIS — D509 Iron deficiency anemia, unspecified: Secondary | ICD-10-CM | POA: Diagnosis not present

## 2016-05-30 DIAGNOSIS — M109 Gout, unspecified: Secondary | ICD-10-CM | POA: Diagnosis not present

## 2016-05-30 DIAGNOSIS — N390 Urinary tract infection, site not specified: Secondary | ICD-10-CM | POA: Diagnosis not present

## 2016-06-01 DIAGNOSIS — Z87440 Personal history of urinary (tract) infections: Secondary | ICD-10-CM | POA: Diagnosis not present

## 2016-06-01 DIAGNOSIS — R569 Unspecified convulsions: Secondary | ICD-10-CM | POA: Diagnosis not present

## 2016-06-01 DIAGNOSIS — G309 Alzheimer's disease, unspecified: Secondary | ICD-10-CM | POA: Diagnosis not present

## 2016-06-01 DIAGNOSIS — I10 Essential (primary) hypertension: Secondary | ICD-10-CM | POA: Diagnosis not present

## 2016-06-01 DIAGNOSIS — F0280 Dementia in other diseases classified elsewhere without behavioral disturbance: Secondary | ICD-10-CM | POA: Diagnosis not present

## 2016-06-01 DIAGNOSIS — Z8673 Personal history of transient ischemic attack (TIA), and cerebral infarction without residual deficits: Secondary | ICD-10-CM | POA: Diagnosis not present

## 2016-06-02 DIAGNOSIS — G309 Alzheimer's disease, unspecified: Secondary | ICD-10-CM | POA: Diagnosis not present

## 2016-06-02 DIAGNOSIS — R569 Unspecified convulsions: Secondary | ICD-10-CM | POA: Diagnosis not present

## 2016-06-02 DIAGNOSIS — Z8673 Personal history of transient ischemic attack (TIA), and cerebral infarction without residual deficits: Secondary | ICD-10-CM | POA: Diagnosis not present

## 2016-06-02 DIAGNOSIS — Z87440 Personal history of urinary (tract) infections: Secondary | ICD-10-CM | POA: Diagnosis not present

## 2016-06-02 DIAGNOSIS — F0280 Dementia in other diseases classified elsewhere without behavioral disturbance: Secondary | ICD-10-CM | POA: Diagnosis not present

## 2016-06-02 DIAGNOSIS — I10 Essential (primary) hypertension: Secondary | ICD-10-CM | POA: Diagnosis not present

## 2016-06-03 DIAGNOSIS — I10 Essential (primary) hypertension: Secondary | ICD-10-CM | POA: Diagnosis not present

## 2016-06-03 DIAGNOSIS — Z87440 Personal history of urinary (tract) infections: Secondary | ICD-10-CM | POA: Diagnosis not present

## 2016-06-03 DIAGNOSIS — F0280 Dementia in other diseases classified elsewhere without behavioral disturbance: Secondary | ICD-10-CM | POA: Diagnosis not present

## 2016-06-03 DIAGNOSIS — Z8673 Personal history of transient ischemic attack (TIA), and cerebral infarction without residual deficits: Secondary | ICD-10-CM | POA: Diagnosis not present

## 2016-06-03 DIAGNOSIS — G309 Alzheimer's disease, unspecified: Secondary | ICD-10-CM | POA: Diagnosis not present

## 2016-06-03 DIAGNOSIS — R569 Unspecified convulsions: Secondary | ICD-10-CM | POA: Diagnosis not present

## 2016-06-04 DIAGNOSIS — Z87440 Personal history of urinary (tract) infections: Secondary | ICD-10-CM | POA: Diagnosis not present

## 2016-06-04 DIAGNOSIS — F0280 Dementia in other diseases classified elsewhere without behavioral disturbance: Secondary | ICD-10-CM | POA: Diagnosis not present

## 2016-06-04 DIAGNOSIS — G309 Alzheimer's disease, unspecified: Secondary | ICD-10-CM | POA: Diagnosis not present

## 2016-06-04 DIAGNOSIS — Z8673 Personal history of transient ischemic attack (TIA), and cerebral infarction without residual deficits: Secondary | ICD-10-CM | POA: Diagnosis not present

## 2016-06-04 DIAGNOSIS — I10 Essential (primary) hypertension: Secondary | ICD-10-CM | POA: Diagnosis not present

## 2016-06-04 DIAGNOSIS — R569 Unspecified convulsions: Secondary | ICD-10-CM | POA: Diagnosis not present

## 2016-06-08 DIAGNOSIS — Z87440 Personal history of urinary (tract) infections: Secondary | ICD-10-CM | POA: Diagnosis not present

## 2016-06-08 DIAGNOSIS — I10 Essential (primary) hypertension: Secondary | ICD-10-CM | POA: Diagnosis not present

## 2016-06-08 DIAGNOSIS — Z8673 Personal history of transient ischemic attack (TIA), and cerebral infarction without residual deficits: Secondary | ICD-10-CM | POA: Diagnosis not present

## 2016-06-08 DIAGNOSIS — R569 Unspecified convulsions: Secondary | ICD-10-CM | POA: Diagnosis not present

## 2016-06-08 DIAGNOSIS — F0280 Dementia in other diseases classified elsewhere without behavioral disturbance: Secondary | ICD-10-CM | POA: Diagnosis not present

## 2016-06-08 DIAGNOSIS — G309 Alzheimer's disease, unspecified: Secondary | ICD-10-CM | POA: Diagnosis not present

## 2016-06-09 DIAGNOSIS — G309 Alzheimer's disease, unspecified: Secondary | ICD-10-CM | POA: Diagnosis not present

## 2016-06-09 DIAGNOSIS — R569 Unspecified convulsions: Secondary | ICD-10-CM | POA: Diagnosis not present

## 2016-06-09 DIAGNOSIS — Z8673 Personal history of transient ischemic attack (TIA), and cerebral infarction without residual deficits: Secondary | ICD-10-CM | POA: Diagnosis not present

## 2016-06-09 DIAGNOSIS — I10 Essential (primary) hypertension: Secondary | ICD-10-CM | POA: Diagnosis not present

## 2016-06-09 DIAGNOSIS — Z87440 Personal history of urinary (tract) infections: Secondary | ICD-10-CM | POA: Diagnosis not present

## 2016-06-09 DIAGNOSIS — F0280 Dementia in other diseases classified elsewhere without behavioral disturbance: Secondary | ICD-10-CM | POA: Diagnosis not present

## 2016-06-15 DIAGNOSIS — G309 Alzheimer's disease, unspecified: Secondary | ICD-10-CM | POA: Diagnosis not present

## 2016-06-15 DIAGNOSIS — Z87440 Personal history of urinary (tract) infections: Secondary | ICD-10-CM | POA: Diagnosis not present

## 2016-06-15 DIAGNOSIS — I10 Essential (primary) hypertension: Secondary | ICD-10-CM | POA: Diagnosis not present

## 2016-06-15 DIAGNOSIS — F0280 Dementia in other diseases classified elsewhere without behavioral disturbance: Secondary | ICD-10-CM | POA: Diagnosis not present

## 2016-06-15 DIAGNOSIS — Z8673 Personal history of transient ischemic attack (TIA), and cerebral infarction without residual deficits: Secondary | ICD-10-CM | POA: Diagnosis not present

## 2016-06-15 DIAGNOSIS — R569 Unspecified convulsions: Secondary | ICD-10-CM | POA: Diagnosis not present

## 2016-06-17 DIAGNOSIS — Z87440 Personal history of urinary (tract) infections: Secondary | ICD-10-CM | POA: Diagnosis not present

## 2016-06-17 DIAGNOSIS — R569 Unspecified convulsions: Secondary | ICD-10-CM | POA: Diagnosis not present

## 2016-06-17 DIAGNOSIS — I10 Essential (primary) hypertension: Secondary | ICD-10-CM | POA: Diagnosis not present

## 2016-06-17 DIAGNOSIS — Z8673 Personal history of transient ischemic attack (TIA), and cerebral infarction without residual deficits: Secondary | ICD-10-CM | POA: Diagnosis not present

## 2016-06-17 DIAGNOSIS — F0280 Dementia in other diseases classified elsewhere without behavioral disturbance: Secondary | ICD-10-CM | POA: Diagnosis not present

## 2016-06-17 DIAGNOSIS — G309 Alzheimer's disease, unspecified: Secondary | ICD-10-CM | POA: Diagnosis not present

## 2016-06-24 DIAGNOSIS — I10 Essential (primary) hypertension: Secondary | ICD-10-CM | POA: Diagnosis not present

## 2016-06-24 DIAGNOSIS — R569 Unspecified convulsions: Secondary | ICD-10-CM | POA: Diagnosis not present

## 2016-06-24 DIAGNOSIS — G309 Alzheimer's disease, unspecified: Secondary | ICD-10-CM | POA: Diagnosis not present

## 2016-06-24 DIAGNOSIS — Z87440 Personal history of urinary (tract) infections: Secondary | ICD-10-CM | POA: Diagnosis not present

## 2016-06-24 DIAGNOSIS — F0280 Dementia in other diseases classified elsewhere without behavioral disturbance: Secondary | ICD-10-CM | POA: Diagnosis not present

## 2016-06-24 DIAGNOSIS — Z8673 Personal history of transient ischemic attack (TIA), and cerebral infarction without residual deficits: Secondary | ICD-10-CM | POA: Diagnosis not present

## 2016-06-25 DIAGNOSIS — G309 Alzheimer's disease, unspecified: Secondary | ICD-10-CM | POA: Diagnosis not present

## 2016-06-25 DIAGNOSIS — Z87440 Personal history of urinary (tract) infections: Secondary | ICD-10-CM | POA: Diagnosis not present

## 2016-06-25 DIAGNOSIS — Z8673 Personal history of transient ischemic attack (TIA), and cerebral infarction without residual deficits: Secondary | ICD-10-CM | POA: Diagnosis not present

## 2016-06-25 DIAGNOSIS — F0280 Dementia in other diseases classified elsewhere without behavioral disturbance: Secondary | ICD-10-CM | POA: Diagnosis not present

## 2016-06-25 DIAGNOSIS — R569 Unspecified convulsions: Secondary | ICD-10-CM | POA: Diagnosis not present

## 2016-06-25 DIAGNOSIS — I10 Essential (primary) hypertension: Secondary | ICD-10-CM | POA: Diagnosis not present

## 2016-07-02 DIAGNOSIS — F068 Other specified mental disorders due to known physiological condition: Secondary | ICD-10-CM | POA: Diagnosis not present

## 2016-07-02 DIAGNOSIS — R569 Unspecified convulsions: Secondary | ICD-10-CM | POA: Diagnosis not present

## 2016-07-02 DIAGNOSIS — I10 Essential (primary) hypertension: Secondary | ICD-10-CM | POA: Diagnosis not present

## 2016-07-02 DIAGNOSIS — G309 Alzheimer's disease, unspecified: Secondary | ICD-10-CM | POA: Diagnosis not present

## 2016-07-02 DIAGNOSIS — Z79899 Other long term (current) drug therapy: Secondary | ICD-10-CM | POA: Diagnosis not present

## 2016-07-02 DIAGNOSIS — Z8673 Personal history of transient ischemic attack (TIA), and cerebral infarction without residual deficits: Secondary | ICD-10-CM | POA: Diagnosis not present

## 2016-07-02 DIAGNOSIS — F0280 Dementia in other diseases classified elsewhere without behavioral disturbance: Secondary | ICD-10-CM | POA: Diagnosis not present

## 2016-07-02 DIAGNOSIS — Z87440 Personal history of urinary (tract) infections: Secondary | ICD-10-CM | POA: Diagnosis not present

## 2016-07-10 DIAGNOSIS — I10 Essential (primary) hypertension: Secondary | ICD-10-CM | POA: Diagnosis not present

## 2016-07-10 DIAGNOSIS — Z8673 Personal history of transient ischemic attack (TIA), and cerebral infarction without residual deficits: Secondary | ICD-10-CM | POA: Diagnosis not present

## 2016-07-10 DIAGNOSIS — G309 Alzheimer's disease, unspecified: Secondary | ICD-10-CM | POA: Diagnosis not present

## 2016-07-10 DIAGNOSIS — Z87440 Personal history of urinary (tract) infections: Secondary | ICD-10-CM | POA: Diagnosis not present

## 2016-07-10 DIAGNOSIS — F0280 Dementia in other diseases classified elsewhere without behavioral disturbance: Secondary | ICD-10-CM | POA: Diagnosis not present

## 2016-07-10 DIAGNOSIS — R569 Unspecified convulsions: Secondary | ICD-10-CM | POA: Diagnosis not present

## 2016-07-12 DIAGNOSIS — Z5181 Encounter for therapeutic drug level monitoring: Secondary | ICD-10-CM | POA: Diagnosis not present

## 2016-07-12 DIAGNOSIS — Z7982 Long term (current) use of aspirin: Secondary | ICD-10-CM | POA: Diagnosis not present

## 2016-07-12 DIAGNOSIS — F0280 Dementia in other diseases classified elsewhere without behavioral disturbance: Secondary | ICD-10-CM | POA: Diagnosis not present

## 2016-07-12 DIAGNOSIS — I10 Essential (primary) hypertension: Secondary | ICD-10-CM | POA: Diagnosis not present

## 2016-07-12 DIAGNOSIS — Z79899 Other long term (current) drug therapy: Secondary | ICD-10-CM | POA: Diagnosis not present

## 2016-07-12 DIAGNOSIS — G40909 Epilepsy, unspecified, not intractable, without status epilepticus: Secondary | ICD-10-CM | POA: Diagnosis not present

## 2016-07-12 DIAGNOSIS — G309 Alzheimer's disease, unspecified: Secondary | ICD-10-CM | POA: Diagnosis not present

## 2016-07-14 DIAGNOSIS — Z5181 Encounter for therapeutic drug level monitoring: Secondary | ICD-10-CM | POA: Diagnosis not present

## 2016-07-14 DIAGNOSIS — F0280 Dementia in other diseases classified elsewhere without behavioral disturbance: Secondary | ICD-10-CM | POA: Diagnosis not present

## 2016-07-14 DIAGNOSIS — G40909 Epilepsy, unspecified, not intractable, without status epilepticus: Secondary | ICD-10-CM | POA: Diagnosis not present

## 2016-07-14 DIAGNOSIS — G309 Alzheimer's disease, unspecified: Secondary | ICD-10-CM | POA: Diagnosis not present

## 2016-07-14 DIAGNOSIS — Z7982 Long term (current) use of aspirin: Secondary | ICD-10-CM | POA: Diagnosis not present

## 2016-07-14 DIAGNOSIS — I10 Essential (primary) hypertension: Secondary | ICD-10-CM | POA: Diagnosis not present

## 2016-07-24 DIAGNOSIS — F0280 Dementia in other diseases classified elsewhere without behavioral disturbance: Secondary | ICD-10-CM | POA: Diagnosis not present

## 2016-07-24 DIAGNOSIS — I10 Essential (primary) hypertension: Secondary | ICD-10-CM | POA: Diagnosis not present

## 2016-07-24 DIAGNOSIS — Z5181 Encounter for therapeutic drug level monitoring: Secondary | ICD-10-CM | POA: Diagnosis not present

## 2016-07-24 DIAGNOSIS — Z7982 Long term (current) use of aspirin: Secondary | ICD-10-CM | POA: Diagnosis not present

## 2016-07-24 DIAGNOSIS — G40909 Epilepsy, unspecified, not intractable, without status epilepticus: Secondary | ICD-10-CM | POA: Diagnosis not present

## 2016-07-24 DIAGNOSIS — G309 Alzheimer's disease, unspecified: Secondary | ICD-10-CM | POA: Diagnosis not present

## 2016-07-31 DIAGNOSIS — F0280 Dementia in other diseases classified elsewhere without behavioral disturbance: Secondary | ICD-10-CM | POA: Diagnosis not present

## 2016-07-31 DIAGNOSIS — Z7982 Long term (current) use of aspirin: Secondary | ICD-10-CM | POA: Diagnosis not present

## 2016-07-31 DIAGNOSIS — G309 Alzheimer's disease, unspecified: Secondary | ICD-10-CM | POA: Diagnosis not present

## 2016-07-31 DIAGNOSIS — I10 Essential (primary) hypertension: Secondary | ICD-10-CM | POA: Diagnosis not present

## 2016-07-31 DIAGNOSIS — G40909 Epilepsy, unspecified, not intractable, without status epilepticus: Secondary | ICD-10-CM | POA: Diagnosis not present

## 2016-07-31 DIAGNOSIS — Z5181 Encounter for therapeutic drug level monitoring: Secondary | ICD-10-CM | POA: Diagnosis not present

## 2016-08-05 DIAGNOSIS — G309 Alzheimer's disease, unspecified: Secondary | ICD-10-CM | POA: Diagnosis not present

## 2016-08-05 DIAGNOSIS — Z5181 Encounter for therapeutic drug level monitoring: Secondary | ICD-10-CM | POA: Diagnosis not present

## 2016-08-05 DIAGNOSIS — I10 Essential (primary) hypertension: Secondary | ICD-10-CM | POA: Diagnosis not present

## 2016-08-05 DIAGNOSIS — F0280 Dementia in other diseases classified elsewhere without behavioral disturbance: Secondary | ICD-10-CM | POA: Diagnosis not present

## 2016-08-05 DIAGNOSIS — Z7982 Long term (current) use of aspirin: Secondary | ICD-10-CM | POA: Diagnosis not present

## 2016-08-05 DIAGNOSIS — G40909 Epilepsy, unspecified, not intractable, without status epilepticus: Secondary | ICD-10-CM | POA: Diagnosis not present

## 2016-08-09 DIAGNOSIS — R569 Unspecified convulsions: Secondary | ICD-10-CM | POA: Diagnosis not present

## 2016-08-09 DIAGNOSIS — N390 Urinary tract infection, site not specified: Secondary | ICD-10-CM | POA: Diagnosis not present

## 2016-08-09 DIAGNOSIS — F068 Other specified mental disorders due to known physiological condition: Secondary | ICD-10-CM | POA: Diagnosis not present

## 2016-08-09 DIAGNOSIS — I10 Essential (primary) hypertension: Secondary | ICD-10-CM | POA: Diagnosis not present

## 2016-08-11 DIAGNOSIS — G40909 Epilepsy, unspecified, not intractable, without status epilepticus: Secondary | ICD-10-CM | POA: Diagnosis not present

## 2016-08-11 DIAGNOSIS — Z5181 Encounter for therapeutic drug level monitoring: Secondary | ICD-10-CM | POA: Diagnosis not present

## 2016-08-11 DIAGNOSIS — Z7982 Long term (current) use of aspirin: Secondary | ICD-10-CM | POA: Diagnosis not present

## 2016-08-11 DIAGNOSIS — G309 Alzheimer's disease, unspecified: Secondary | ICD-10-CM | POA: Diagnosis not present

## 2016-08-11 DIAGNOSIS — I10 Essential (primary) hypertension: Secondary | ICD-10-CM | POA: Diagnosis not present

## 2016-08-11 DIAGNOSIS — F0280 Dementia in other diseases classified elsewhere without behavioral disturbance: Secondary | ICD-10-CM | POA: Diagnosis not present

## 2016-08-14 DIAGNOSIS — I4891 Unspecified atrial fibrillation: Secondary | ICD-10-CM | POA: Diagnosis present

## 2016-08-14 DIAGNOSIS — J181 Lobar pneumonia, unspecified organism: Secondary | ICD-10-CM | POA: Diagnosis not present

## 2016-08-14 DIAGNOSIS — F0390 Unspecified dementia without behavioral disturbance: Secondary | ICD-10-CM | POA: Diagnosis not present

## 2016-08-14 DIAGNOSIS — E785 Hyperlipidemia, unspecified: Secondary | ICD-10-CM | POA: Diagnosis present

## 2016-08-14 DIAGNOSIS — G9340 Encephalopathy, unspecified: Secondary | ICD-10-CM | POA: Diagnosis not present

## 2016-08-14 DIAGNOSIS — R071 Chest pain on breathing: Secondary | ICD-10-CM | POA: Diagnosis not present

## 2016-08-14 DIAGNOSIS — J189 Pneumonia, unspecified organism: Secondary | ICD-10-CM | POA: Diagnosis present

## 2016-08-14 DIAGNOSIS — R0902 Hypoxemia: Secondary | ICD-10-CM | POA: Diagnosis not present

## 2016-08-14 DIAGNOSIS — Z7982 Long term (current) use of aspirin: Secondary | ICD-10-CM | POA: Diagnosis not present

## 2016-08-14 DIAGNOSIS — G309 Alzheimer's disease, unspecified: Secondary | ICD-10-CM | POA: Diagnosis present

## 2016-08-14 DIAGNOSIS — W19XXXA Unspecified fall, initial encounter: Secondary | ICD-10-CM | POA: Diagnosis not present

## 2016-08-14 DIAGNOSIS — G40909 Epilepsy, unspecified, not intractable, without status epilepticus: Secondary | ICD-10-CM | POA: Diagnosis present

## 2016-08-14 DIAGNOSIS — Z79899 Other long term (current) drug therapy: Secondary | ICD-10-CM | POA: Diagnosis not present

## 2016-08-14 DIAGNOSIS — R0602 Shortness of breath: Secondary | ICD-10-CM | POA: Diagnosis not present

## 2016-08-14 DIAGNOSIS — N3281 Overactive bladder: Secondary | ICD-10-CM | POA: Diagnosis present

## 2016-08-14 DIAGNOSIS — I10 Essential (primary) hypertension: Secondary | ICD-10-CM | POA: Diagnosis present

## 2016-08-14 DIAGNOSIS — Z8673 Personal history of transient ischemic attack (TIA), and cerebral infarction without residual deficits: Secondary | ICD-10-CM | POA: Diagnosis not present

## 2016-08-14 DIAGNOSIS — R569 Unspecified convulsions: Secondary | ICD-10-CM | POA: Diagnosis not present

## 2016-08-14 DIAGNOSIS — G301 Alzheimer's disease with late onset: Secondary | ICD-10-CM | POA: Diagnosis not present

## 2016-08-14 DIAGNOSIS — Z9989 Dependence on other enabling machines and devices: Secondary | ICD-10-CM | POA: Diagnosis not present

## 2016-08-14 DIAGNOSIS — F0280 Dementia in other diseases classified elsewhere without behavioral disturbance: Secondary | ICD-10-CM | POA: Diagnosis present

## 2016-08-14 DIAGNOSIS — S6991XA Unspecified injury of right wrist, hand and finger(s), initial encounter: Secondary | ICD-10-CM | POA: Diagnosis not present

## 2016-08-14 DIAGNOSIS — S60221A Contusion of right hand, initial encounter: Secondary | ICD-10-CM | POA: Diagnosis not present

## 2016-08-14 DIAGNOSIS — R918 Other nonspecific abnormal finding of lung field: Secondary | ICD-10-CM | POA: Diagnosis not present

## 2016-08-14 DIAGNOSIS — J90 Pleural effusion, not elsewhere classified: Secondary | ICD-10-CM | POA: Diagnosis not present

## 2016-08-18 DIAGNOSIS — I10 Essential (primary) hypertension: Secondary | ICD-10-CM | POA: Diagnosis not present

## 2016-08-18 DIAGNOSIS — Z79899 Other long term (current) drug therapy: Secondary | ICD-10-CM | POA: Diagnosis not present

## 2016-08-18 DIAGNOSIS — F0280 Dementia in other diseases classified elsewhere without behavioral disturbance: Secondary | ICD-10-CM | POA: Diagnosis not present

## 2016-08-18 DIAGNOSIS — G309 Alzheimer's disease, unspecified: Secondary | ICD-10-CM | POA: Diagnosis not present

## 2016-08-18 DIAGNOSIS — G40909 Epilepsy, unspecified, not intractable, without status epilepticus: Secondary | ICD-10-CM | POA: Diagnosis not present

## 2016-08-18 DIAGNOSIS — Z7982 Long term (current) use of aspirin: Secondary | ICD-10-CM | POA: Diagnosis not present

## 2016-08-19 DIAGNOSIS — Z743 Need for continuous supervision: Secondary | ICD-10-CM | POA: Diagnosis not present

## 2016-08-19 DIAGNOSIS — N289 Disorder of kidney and ureter, unspecified: Secondary | ICD-10-CM | POA: Diagnosis not present

## 2016-08-19 DIAGNOSIS — Z7982 Long term (current) use of aspirin: Secondary | ICD-10-CM | POA: Diagnosis not present

## 2016-08-19 DIAGNOSIS — S299XXA Unspecified injury of thorax, initial encounter: Secondary | ICD-10-CM | POA: Diagnosis not present

## 2016-08-19 DIAGNOSIS — D509 Iron deficiency anemia, unspecified: Secondary | ICD-10-CM | POA: Diagnosis not present

## 2016-08-19 DIAGNOSIS — M6281 Muscle weakness (generalized): Secondary | ICD-10-CM | POA: Diagnosis not present

## 2016-08-19 DIAGNOSIS — G40909 Epilepsy, unspecified, not intractable, without status epilepticus: Secondary | ICD-10-CM | POA: Diagnosis present

## 2016-08-19 DIAGNOSIS — N179 Acute kidney failure, unspecified: Secondary | ICD-10-CM | POA: Diagnosis not present

## 2016-08-19 DIAGNOSIS — E559 Vitamin D deficiency, unspecified: Secondary | ICD-10-CM | POA: Diagnosis present

## 2016-08-19 DIAGNOSIS — D539 Nutritional anemia, unspecified: Secondary | ICD-10-CM | POA: Diagnosis not present

## 2016-08-19 DIAGNOSIS — W010XXA Fall on same level from slipping, tripping and stumbling without subsequent striking against object, initial encounter: Secondary | ICD-10-CM | POA: Diagnosis not present

## 2016-08-19 DIAGNOSIS — E7800 Pure hypercholesterolemia, unspecified: Secondary | ICD-10-CM | POA: Diagnosis not present

## 2016-08-19 DIAGNOSIS — R918 Other nonspecific abnormal finding of lung field: Secondary | ICD-10-CM | POA: Diagnosis not present

## 2016-08-19 DIAGNOSIS — Y92129 Unspecified place in nursing home as the place of occurrence of the external cause: Secondary | ICD-10-CM | POA: Diagnosis not present

## 2016-08-19 DIAGNOSIS — E539 Vitamin B deficiency, unspecified: Secondary | ICD-10-CM | POA: Diagnosis not present

## 2016-08-19 DIAGNOSIS — Z96641 Presence of right artificial hip joint: Secondary | ICD-10-CM | POA: Diagnosis not present

## 2016-08-19 DIAGNOSIS — I4891 Unspecified atrial fibrillation: Secondary | ICD-10-CM | POA: Diagnosis present

## 2016-08-19 DIAGNOSIS — G4089 Other seizures: Secondary | ICD-10-CM | POA: Diagnosis not present

## 2016-08-19 DIAGNOSIS — G301 Alzheimer's disease with late onset: Secondary | ICD-10-CM | POA: Diagnosis present

## 2016-08-19 DIAGNOSIS — E039 Hypothyroidism, unspecified: Secondary | ICD-10-CM | POA: Diagnosis present

## 2016-08-19 DIAGNOSIS — I2510 Atherosclerotic heart disease of native coronary artery without angina pectoris: Secondary | ICD-10-CM | POA: Diagnosis not present

## 2016-08-19 DIAGNOSIS — F0280 Dementia in other diseases classified elsewhere without behavioral disturbance: Secondary | ICD-10-CM | POA: Diagnosis present

## 2016-08-19 DIAGNOSIS — S7221XS Displaced subtrochanteric fracture of right femur, sequela: Secondary | ICD-10-CM | POA: Diagnosis not present

## 2016-08-19 DIAGNOSIS — F329 Major depressive disorder, single episode, unspecified: Secondary | ICD-10-CM | POA: Diagnosis present

## 2016-08-19 DIAGNOSIS — Z79899 Other long term (current) drug therapy: Secondary | ICD-10-CM | POA: Diagnosis not present

## 2016-08-19 DIAGNOSIS — I129 Hypertensive chronic kidney disease with stage 1 through stage 4 chronic kidney disease, or unspecified chronic kidney disease: Secondary | ICD-10-CM | POA: Diagnosis present

## 2016-08-19 DIAGNOSIS — I361 Nonrheumatic tricuspid (valve) insufficiency: Secondary | ICD-10-CM | POA: Diagnosis not present

## 2016-08-19 DIAGNOSIS — G309 Alzheimer's disease, unspecified: Secondary | ICD-10-CM | POA: Diagnosis not present

## 2016-08-19 DIAGNOSIS — I10 Essential (primary) hypertension: Secondary | ICD-10-CM | POA: Diagnosis not present

## 2016-08-19 DIAGNOSIS — R1312 Dysphagia, oropharyngeal phase: Secondary | ICD-10-CM | POA: Diagnosis not present

## 2016-08-19 DIAGNOSIS — S72061A Displaced articular fracture of head of right femur, initial encounter for closed fracture: Secondary | ICD-10-CM | POA: Diagnosis not present

## 2016-08-19 DIAGNOSIS — J181 Lobar pneumonia, unspecified organism: Secondary | ICD-10-CM | POA: Diagnosis not present

## 2016-08-19 DIAGNOSIS — R8299 Other abnormal findings in urine: Secondary | ICD-10-CM | POA: Diagnosis not present

## 2016-08-19 DIAGNOSIS — S199XXA Unspecified injury of neck, initial encounter: Secondary | ICD-10-CM | POA: Diagnosis not present

## 2016-08-19 DIAGNOSIS — D72829 Elevated white blood cell count, unspecified: Secondary | ICD-10-CM | POA: Diagnosis present

## 2016-08-19 DIAGNOSIS — D473 Essential (hemorrhagic) thrombocythemia: Secondary | ICD-10-CM | POA: Diagnosis not present

## 2016-08-19 DIAGNOSIS — R52 Pain, unspecified: Secondary | ICD-10-CM | POA: Diagnosis not present

## 2016-08-19 DIAGNOSIS — M25551 Pain in right hip: Secondary | ICD-10-CM | POA: Diagnosis not present

## 2016-08-19 DIAGNOSIS — E785 Hyperlipidemia, unspecified: Secondary | ICD-10-CM | POA: Diagnosis present

## 2016-08-19 DIAGNOSIS — R4182 Altered mental status, unspecified: Secondary | ICD-10-CM | POA: Diagnosis not present

## 2016-08-19 DIAGNOSIS — R488 Other symbolic dysfunctions: Secondary | ICD-10-CM | POA: Diagnosis not present

## 2016-08-19 DIAGNOSIS — E876 Hypokalemia: Secondary | ICD-10-CM | POA: Diagnosis not present

## 2016-08-19 DIAGNOSIS — W050XXA Fall from non-moving wheelchair, initial encounter: Secondary | ICD-10-CM | POA: Diagnosis not present

## 2016-08-19 DIAGNOSIS — I4892 Unspecified atrial flutter: Secondary | ICD-10-CM | POA: Diagnosis not present

## 2016-08-19 DIAGNOSIS — J189 Pneumonia, unspecified organism: Secondary | ICD-10-CM | POA: Diagnosis present

## 2016-08-19 DIAGNOSIS — M25559 Pain in unspecified hip: Secondary | ICD-10-CM | POA: Diagnosis not present

## 2016-08-19 DIAGNOSIS — E538 Deficiency of other specified B group vitamins: Secondary | ICD-10-CM | POA: Diagnosis present

## 2016-08-19 DIAGNOSIS — G8929 Other chronic pain: Secondary | ICD-10-CM | POA: Diagnosis not present

## 2016-08-19 DIAGNOSIS — E870 Hyperosmolality and hypernatremia: Secondary | ICD-10-CM | POA: Diagnosis not present

## 2016-08-19 DIAGNOSIS — Z471 Aftercare following joint replacement surgery: Secondary | ICD-10-CM | POA: Diagnosis not present

## 2016-08-19 DIAGNOSIS — S72011A Unspecified intracapsular fracture of right femur, initial encounter for closed fracture: Secondary | ICD-10-CM | POA: Diagnosis present

## 2016-08-19 DIAGNOSIS — N390 Urinary tract infection, site not specified: Secondary | ICD-10-CM | POA: Diagnosis not present

## 2016-08-19 DIAGNOSIS — W1839XA Other fall on same level, initial encounter: Secondary | ICD-10-CM | POA: Diagnosis not present

## 2016-08-19 DIAGNOSIS — J9811 Atelectasis: Secondary | ICD-10-CM | POA: Diagnosis not present

## 2016-08-19 DIAGNOSIS — R739 Hyperglycemia, unspecified: Secondary | ICD-10-CM | POA: Diagnosis not present

## 2016-08-19 DIAGNOSIS — S72001A Fracture of unspecified part of neck of right femur, initial encounter for closed fracture: Secondary | ICD-10-CM | POA: Diagnosis not present

## 2016-08-19 DIAGNOSIS — N183 Chronic kidney disease, stage 3 (moderate): Secondary | ICD-10-CM | POA: Diagnosis present

## 2016-08-19 DIAGNOSIS — N3281 Overactive bladder: Secondary | ICD-10-CM | POA: Diagnosis present

## 2016-08-19 DIAGNOSIS — R262 Difficulty in walking, not elsewhere classified: Secondary | ICD-10-CM | POA: Diagnosis not present

## 2016-08-19 DIAGNOSIS — Z01810 Encounter for preprocedural cardiovascular examination: Secondary | ICD-10-CM | POA: Diagnosis not present

## 2016-08-28 DIAGNOSIS — B377 Candidal sepsis: Secondary | ICD-10-CM | POA: Diagnosis not present

## 2016-08-28 DIAGNOSIS — N289 Disorder of kidney and ureter, unspecified: Secondary | ICD-10-CM | POA: Diagnosis not present

## 2016-08-28 DIAGNOSIS — S72061A Displaced articular fracture of head of right femur, initial encounter for closed fracture: Secondary | ICD-10-CM | POA: Diagnosis not present

## 2016-08-28 DIAGNOSIS — E872 Acidosis: Secondary | ICD-10-CM | POA: Diagnosis not present

## 2016-08-28 DIAGNOSIS — M25551 Pain in right hip: Secondary | ICD-10-CM | POA: Diagnosis not present

## 2016-08-28 DIAGNOSIS — I639 Cerebral infarction, unspecified: Secondary | ICD-10-CM | POA: Diagnosis not present

## 2016-08-28 DIAGNOSIS — G9341 Metabolic encephalopathy: Secondary | ICD-10-CM | POA: Diagnosis present

## 2016-08-28 DIAGNOSIS — N390 Urinary tract infection, site not specified: Secondary | ICD-10-CM | POA: Diagnosis present

## 2016-08-28 DIAGNOSIS — F329 Major depressive disorder, single episode, unspecified: Secondary | ICD-10-CM | POA: Diagnosis present

## 2016-08-28 DIAGNOSIS — N179 Acute kidney failure, unspecified: Secondary | ICD-10-CM | POA: Diagnosis not present

## 2016-08-28 DIAGNOSIS — R262 Difficulty in walking, not elsewhere classified: Secondary | ICD-10-CM | POA: Diagnosis not present

## 2016-08-28 DIAGNOSIS — I4891 Unspecified atrial fibrillation: Secondary | ICD-10-CM | POA: Diagnosis not present

## 2016-08-28 DIAGNOSIS — F0390 Unspecified dementia without behavioral disturbance: Secondary | ICD-10-CM | POA: Diagnosis not present

## 2016-08-28 DIAGNOSIS — R6520 Severe sepsis without septic shock: Secondary | ICD-10-CM | POA: Diagnosis not present

## 2016-08-28 DIAGNOSIS — E539 Vitamin B deficiency, unspecified: Secondary | ICD-10-CM | POA: Diagnosis not present

## 2016-08-28 DIAGNOSIS — B961 Klebsiella pneumoniae [K. pneumoniae] as the cause of diseases classified elsewhere: Secondary | ICD-10-CM | POA: Diagnosis present

## 2016-08-28 DIAGNOSIS — E875 Hyperkalemia: Secondary | ICD-10-CM | POA: Diagnosis present

## 2016-08-28 DIAGNOSIS — Z5189 Encounter for other specified aftercare: Secondary | ICD-10-CM | POA: Diagnosis not present

## 2016-08-28 DIAGNOSIS — E7800 Pure hypercholesterolemia, unspecified: Secondary | ICD-10-CM | POA: Diagnosis not present

## 2016-08-28 DIAGNOSIS — E785 Hyperlipidemia, unspecified: Secondary | ICD-10-CM | POA: Diagnosis present

## 2016-08-28 DIAGNOSIS — G309 Alzheimer's disease, unspecified: Secondary | ICD-10-CM | POA: Diagnosis present

## 2016-08-28 DIAGNOSIS — J9601 Acute respiratory failure with hypoxia: Secondary | ICD-10-CM | POA: Diagnosis not present

## 2016-08-28 DIAGNOSIS — R4182 Altered mental status, unspecified: Secondary | ICD-10-CM | POA: Diagnosis not present

## 2016-08-28 DIAGNOSIS — S72001D Fracture of unspecified part of neck of right femur, subsequent encounter for closed fracture with routine healing: Secondary | ICD-10-CM | POA: Diagnosis not present

## 2016-08-28 DIAGNOSIS — Z96641 Presence of right artificial hip joint: Secondary | ICD-10-CM | POA: Diagnosis present

## 2016-08-28 DIAGNOSIS — M6281 Muscle weakness (generalized): Secondary | ICD-10-CM | POA: Diagnosis not present

## 2016-08-28 DIAGNOSIS — G40909 Epilepsy, unspecified, not intractable, without status epilepticus: Secondary | ICD-10-CM | POA: Diagnosis present

## 2016-08-28 DIAGNOSIS — Z743 Need for continuous supervision: Secondary | ICD-10-CM | POA: Diagnosis not present

## 2016-08-28 DIAGNOSIS — Z79899 Other long term (current) drug therapy: Secondary | ICD-10-CM | POA: Diagnosis not present

## 2016-08-28 DIAGNOSIS — J690 Pneumonitis due to inhalation of food and vomit: Secondary | ICD-10-CM | POA: Diagnosis not present

## 2016-08-28 DIAGNOSIS — R918 Other nonspecific abnormal finding of lung field: Secondary | ICD-10-CM | POA: Diagnosis not present

## 2016-08-28 DIAGNOSIS — R1310 Dysphagia, unspecified: Secondary | ICD-10-CM | POA: Diagnosis not present

## 2016-08-28 DIAGNOSIS — R9089 Other abnormal findings on diagnostic imaging of central nervous system: Secondary | ICD-10-CM | POA: Diagnosis not present

## 2016-08-28 DIAGNOSIS — Z8673 Personal history of transient ischemic attack (TIA), and cerebral infarction without residual deficits: Secondary | ICD-10-CM | POA: Diagnosis not present

## 2016-08-28 DIAGNOSIS — W010XXD Fall on same level from slipping, tripping and stumbling without subsequent striking against object, subsequent encounter: Secondary | ICD-10-CM | POA: Diagnosis not present

## 2016-08-28 DIAGNOSIS — Z7982 Long term (current) use of aspirin: Secondary | ICD-10-CM | POA: Diagnosis not present

## 2016-08-28 DIAGNOSIS — R488 Other symbolic dysfunctions: Secondary | ICD-10-CM | POA: Diagnosis not present

## 2016-08-28 DIAGNOSIS — R1312 Dysphagia, oropharyngeal phase: Secondary | ICD-10-CM | POA: Diagnosis not present

## 2016-08-28 DIAGNOSIS — S7291XD Unspecified fracture of right femur, subsequent encounter for closed fracture with routine healing: Secondary | ICD-10-CM | POA: Diagnosis not present

## 2016-08-28 DIAGNOSIS — J9811 Atelectasis: Secondary | ICD-10-CM | POA: Diagnosis not present

## 2016-08-28 DIAGNOSIS — E559 Vitamin D deficiency, unspecified: Secondary | ICD-10-CM | POA: Diagnosis present

## 2016-08-28 DIAGNOSIS — S7221XD Displaced subtrochanteric fracture of right femur, subsequent encounter for closed fracture with routine healing: Secondary | ICD-10-CM | POA: Diagnosis not present

## 2016-08-28 DIAGNOSIS — G8929 Other chronic pain: Secondary | ICD-10-CM | POA: Diagnosis not present

## 2016-08-28 DIAGNOSIS — E538 Deficiency of other specified B group vitamins: Secondary | ICD-10-CM | POA: Diagnosis present

## 2016-08-28 DIAGNOSIS — G9340 Encephalopathy, unspecified: Secondary | ICD-10-CM | POA: Diagnosis not present

## 2016-08-28 DIAGNOSIS — S7221XS Displaced subtrochanteric fracture of right femur, sequela: Secondary | ICD-10-CM | POA: Diagnosis not present

## 2016-08-28 DIAGNOSIS — R410 Disorientation, unspecified: Secondary | ICD-10-CM | POA: Diagnosis not present

## 2016-08-28 DIAGNOSIS — E870 Hyperosmolality and hypernatremia: Secondary | ICD-10-CM | POA: Diagnosis present

## 2016-08-28 DIAGNOSIS — F0391 Unspecified dementia with behavioral disturbance: Secondary | ICD-10-CM | POA: Diagnosis not present

## 2016-08-28 DIAGNOSIS — I2510 Atherosclerotic heart disease of native coronary artery without angina pectoris: Secondary | ICD-10-CM | POA: Diagnosis not present

## 2016-08-28 DIAGNOSIS — B49 Unspecified mycosis: Secondary | ICD-10-CM | POA: Diagnosis not present

## 2016-08-28 DIAGNOSIS — A419 Sepsis, unspecified organism: Secondary | ICD-10-CM | POA: Diagnosis not present

## 2016-08-28 DIAGNOSIS — G4089 Other seizures: Secondary | ICD-10-CM | POA: Diagnosis not present

## 2016-08-28 DIAGNOSIS — F0280 Dementia in other diseases classified elsewhere without behavioral disturbance: Secondary | ICD-10-CM | POA: Diagnosis present

## 2016-08-28 DIAGNOSIS — I10 Essential (primary) hypertension: Secondary | ICD-10-CM | POA: Diagnosis not present

## 2016-08-31 DIAGNOSIS — F0390 Unspecified dementia without behavioral disturbance: Secondary | ICD-10-CM | POA: Diagnosis not present

## 2016-08-31 DIAGNOSIS — S7291XD Unspecified fracture of right femur, subsequent encounter for closed fracture with routine healing: Secondary | ICD-10-CM | POA: Diagnosis not present

## 2016-08-31 DIAGNOSIS — M25551 Pain in right hip: Secondary | ICD-10-CM | POA: Diagnosis not present

## 2016-08-31 DIAGNOSIS — I4891 Unspecified atrial fibrillation: Secondary | ICD-10-CM | POA: Diagnosis not present

## 2016-08-31 DIAGNOSIS — I10 Essential (primary) hypertension: Secondary | ICD-10-CM | POA: Diagnosis not present

## 2016-09-02 DIAGNOSIS — S7291XD Unspecified fracture of right femur, subsequent encounter for closed fracture with routine healing: Secondary | ICD-10-CM | POA: Diagnosis not present

## 2016-09-02 DIAGNOSIS — I10 Essential (primary) hypertension: Secondary | ICD-10-CM | POA: Diagnosis not present

## 2016-09-02 DIAGNOSIS — M25551 Pain in right hip: Secondary | ICD-10-CM | POA: Diagnosis not present

## 2016-09-02 DIAGNOSIS — F0390 Unspecified dementia without behavioral disturbance: Secondary | ICD-10-CM | POA: Diagnosis not present

## 2016-09-02 DIAGNOSIS — I4891 Unspecified atrial fibrillation: Secondary | ICD-10-CM | POA: Diagnosis not present

## 2016-09-03 DIAGNOSIS — F0391 Unspecified dementia with behavioral disturbance: Secondary | ICD-10-CM | POA: Diagnosis not present

## 2016-09-04 DIAGNOSIS — R262 Difficulty in walking, not elsewhere classified: Secondary | ICD-10-CM | POA: Diagnosis not present

## 2016-09-04 DIAGNOSIS — S7221XS Displaced subtrochanteric fracture of right femur, sequela: Secondary | ICD-10-CM | POA: Diagnosis not present

## 2016-09-04 DIAGNOSIS — W010XXD Fall on same level from slipping, tripping and stumbling without subsequent striking against object, subsequent encounter: Secondary | ICD-10-CM | POA: Diagnosis not present

## 2016-09-04 DIAGNOSIS — M6281 Muscle weakness (generalized): Secondary | ICD-10-CM | POA: Diagnosis not present

## 2016-09-07 DIAGNOSIS — Z5189 Encounter for other specified aftercare: Secondary | ICD-10-CM | POA: Diagnosis not present

## 2016-09-07 DIAGNOSIS — W010XXD Fall on same level from slipping, tripping and stumbling without subsequent striking against object, subsequent encounter: Secondary | ICD-10-CM | POA: Diagnosis not present

## 2016-09-07 DIAGNOSIS — M6281 Muscle weakness (generalized): Secondary | ICD-10-CM | POA: Diagnosis not present

## 2016-09-07 DIAGNOSIS — S7221XD Displaced subtrochanteric fracture of right femur, subsequent encounter for closed fracture with routine healing: Secondary | ICD-10-CM | POA: Diagnosis not present

## 2016-09-09 DIAGNOSIS — R112 Nausea with vomiting, unspecified: Secondary | ICD-10-CM | POA: Diagnosis not present

## 2016-09-09 DIAGNOSIS — J690 Pneumonitis due to inhalation of food and vomit: Secondary | ICD-10-CM | POA: Diagnosis not present

## 2016-09-09 DIAGNOSIS — Z8673 Personal history of transient ischemic attack (TIA), and cerebral infarction without residual deficits: Secondary | ICD-10-CM | POA: Diagnosis not present

## 2016-09-09 DIAGNOSIS — B377 Candidal sepsis: Secondary | ICD-10-CM | POA: Diagnosis not present

## 2016-09-09 DIAGNOSIS — Z743 Need for continuous supervision: Secondary | ICD-10-CM | POA: Diagnosis not present

## 2016-09-09 DIAGNOSIS — J398 Other specified diseases of upper respiratory tract: Secondary | ICD-10-CM | POA: Diagnosis not present

## 2016-09-09 DIAGNOSIS — J9811 Atelectasis: Secondary | ICD-10-CM | POA: Diagnosis not present

## 2016-09-09 DIAGNOSIS — R6520 Severe sepsis without septic shock: Secondary | ICD-10-CM | POA: Diagnosis not present

## 2016-09-09 DIAGNOSIS — R9089 Other abnormal findings on diagnostic imaging of central nervous system: Secondary | ICD-10-CM | POA: Diagnosis not present

## 2016-09-09 DIAGNOSIS — G9340 Encephalopathy, unspecified: Secondary | ICD-10-CM | POA: Diagnosis not present

## 2016-09-09 DIAGNOSIS — G40909 Epilepsy, unspecified, not intractable, without status epilepticus: Secondary | ICD-10-CM | POA: Diagnosis present

## 2016-09-09 DIAGNOSIS — G9341 Metabolic encephalopathy: Secondary | ICD-10-CM | POA: Diagnosis present

## 2016-09-09 DIAGNOSIS — E538 Deficiency of other specified B group vitamins: Secondary | ICD-10-CM | POA: Diagnosis present

## 2016-09-09 DIAGNOSIS — I6523 Occlusion and stenosis of bilateral carotid arteries: Secondary | ICD-10-CM | POA: Diagnosis not present

## 2016-09-09 DIAGNOSIS — G301 Alzheimer's disease with late onset: Secondary | ICD-10-CM | POA: Diagnosis not present

## 2016-09-09 DIAGNOSIS — R4182 Altered mental status, unspecified: Secondary | ICD-10-CM | POA: Diagnosis not present

## 2016-09-09 DIAGNOSIS — J811 Chronic pulmonary edema: Secondary | ICD-10-CM | POA: Diagnosis not present

## 2016-09-09 DIAGNOSIS — J189 Pneumonia, unspecified organism: Secondary | ICD-10-CM | POA: Diagnosis not present

## 2016-09-09 DIAGNOSIS — I6350 Cerebral infarction due to unspecified occlusion or stenosis of unspecified cerebral artery: Secondary | ICD-10-CM | POA: Diagnosis not present

## 2016-09-09 DIAGNOSIS — R1110 Vomiting, unspecified: Secondary | ICD-10-CM | POA: Diagnosis not present

## 2016-09-09 DIAGNOSIS — R410 Disorientation, unspecified: Secondary | ICD-10-CM | POA: Diagnosis not present

## 2016-09-09 DIAGNOSIS — Z452 Encounter for adjustment and management of vascular access device: Secondary | ICD-10-CM | POA: Diagnosis not present

## 2016-09-09 DIAGNOSIS — R0989 Other specified symptoms and signs involving the circulatory and respiratory systems: Secondary | ICD-10-CM | POA: Diagnosis not present

## 2016-09-09 DIAGNOSIS — R918 Other nonspecific abnormal finding of lung field: Secondary | ICD-10-CM | POA: Diagnosis not present

## 2016-09-09 DIAGNOSIS — B49 Unspecified mycosis: Secondary | ICD-10-CM | POA: Diagnosis not present

## 2016-09-09 DIAGNOSIS — R0902 Hypoxemia: Secondary | ICD-10-CM | POA: Diagnosis not present

## 2016-09-09 DIAGNOSIS — E870 Hyperosmolality and hypernatremia: Secondary | ICD-10-CM | POA: Diagnosis present

## 2016-09-09 DIAGNOSIS — F0390 Unspecified dementia without behavioral disturbance: Secondary | ICD-10-CM | POA: Diagnosis not present

## 2016-09-09 DIAGNOSIS — R0603 Acute respiratory distress: Secondary | ICD-10-CM | POA: Diagnosis not present

## 2016-09-09 DIAGNOSIS — N390 Urinary tract infection, site not specified: Secondary | ICD-10-CM | POA: Diagnosis not present

## 2016-09-09 DIAGNOSIS — G309 Alzheimer's disease, unspecified: Secondary | ICD-10-CM | POA: Diagnosis present

## 2016-09-09 DIAGNOSIS — A419 Sepsis, unspecified organism: Secondary | ICD-10-CM | POA: Diagnosis not present

## 2016-09-09 DIAGNOSIS — N179 Acute kidney failure, unspecified: Secondary | ICD-10-CM | POA: Diagnosis not present

## 2016-09-09 DIAGNOSIS — I10 Essential (primary) hypertension: Secondary | ICD-10-CM | POA: Diagnosis present

## 2016-09-09 DIAGNOSIS — J90 Pleural effusion, not elsewhere classified: Secondary | ICD-10-CM | POA: Diagnosis not present

## 2016-09-09 DIAGNOSIS — S72001D Fracture of unspecified part of neck of right femur, subsequent encounter for closed fracture with routine healing: Secondary | ICD-10-CM | POA: Diagnosis not present

## 2016-09-09 DIAGNOSIS — I639 Cerebral infarction, unspecified: Secondary | ICD-10-CM | POA: Diagnosis not present

## 2016-09-09 DIAGNOSIS — F329 Major depressive disorder, single episode, unspecified: Secondary | ICD-10-CM | POA: Diagnosis present

## 2016-09-09 DIAGNOSIS — Z7982 Long term (current) use of aspirin: Secondary | ICD-10-CM | POA: Diagnosis not present

## 2016-09-09 DIAGNOSIS — E785 Hyperlipidemia, unspecified: Secondary | ICD-10-CM | POA: Diagnosis present

## 2016-09-09 DIAGNOSIS — R1310 Dysphagia, unspecified: Secondary | ICD-10-CM | POA: Diagnosis not present

## 2016-09-09 DIAGNOSIS — J984 Other disorders of lung: Secondary | ICD-10-CM | POA: Diagnosis not present

## 2016-09-09 DIAGNOSIS — J9600 Acute respiratory failure, unspecified whether with hypoxia or hypercapnia: Secondary | ICD-10-CM | POA: Diagnosis not present

## 2016-09-09 DIAGNOSIS — S0990XA Unspecified injury of head, initial encounter: Secondary | ICD-10-CM | POA: Diagnosis not present

## 2016-09-09 DIAGNOSIS — E559 Vitamin D deficiency, unspecified: Secondary | ICD-10-CM | POA: Diagnosis present

## 2016-09-09 DIAGNOSIS — Z79899 Other long term (current) drug therapy: Secondary | ICD-10-CM | POA: Diagnosis not present

## 2016-09-09 DIAGNOSIS — B961 Klebsiella pneumoniae [K. pneumoniae] as the cause of diseases classified elsewhere: Secondary | ICD-10-CM | POA: Diagnosis present

## 2016-09-09 DIAGNOSIS — E872 Acidosis: Secondary | ICD-10-CM | POA: Diagnosis not present

## 2016-09-09 DIAGNOSIS — J9601 Acute respiratory failure with hypoxia: Secondary | ICD-10-CM | POA: Diagnosis not present

## 2016-09-09 DIAGNOSIS — J9819 Other pulmonary collapse: Secondary | ICD-10-CM | POA: Diagnosis not present

## 2016-09-09 DIAGNOSIS — E875 Hyperkalemia: Secondary | ICD-10-CM | POA: Diagnosis present

## 2016-09-09 DIAGNOSIS — Z515 Encounter for palliative care: Secondary | ICD-10-CM | POA: Diagnosis not present

## 2016-09-09 DIAGNOSIS — F0280 Dementia in other diseases classified elsewhere without behavioral disturbance: Secondary | ICD-10-CM | POA: Diagnosis present

## 2016-09-09 DIAGNOSIS — Z96641 Presence of right artificial hip joint: Secondary | ICD-10-CM | POA: Diagnosis present

## 2016-09-09 DIAGNOSIS — R0600 Dyspnea, unspecified: Secondary | ICD-10-CM | POA: Diagnosis not present

## 2016-09-09 DIAGNOSIS — J948 Other specified pleural conditions: Secondary | ICD-10-CM | POA: Diagnosis not present

## 2016-09-09 DIAGNOSIS — Z7189 Other specified counseling: Secondary | ICD-10-CM | POA: Diagnosis not present

## 2016-09-09 DIAGNOSIS — Z4682 Encounter for fitting and adjustment of non-vascular catheter: Secondary | ICD-10-CM | POA: Diagnosis not present

## 2016-10-04 DEATH — deceased

## 2017-01-03 DEATH — deceased

## 2017-01-25 ENCOUNTER — Telehealth: Payer: Self-pay | Admitting: Family Medicine

## 2017-01-25 NOTE — Telephone Encounter (Signed)
Patient's son,Hannah Howell, called.  He said someone had just called asking for his mother.  He said patient passed away on 2017/01/22.

## 2017-01-27 NOTE — Telephone Encounter (Signed)
Called son, left message, expressed my condolences.

## 2017-01-29 NOTE — Telephone Encounter (Signed)
Patient's son called back and wanted to tell Dr.Gutierrez thank you for the call.
